# Patient Record
Sex: Female | Born: 1970 | Race: White | Hispanic: No | Marital: Married | Smoking: Current every day smoker
Health system: Southern US, Community
[De-identification: ages and names within clinical notes are randomized; demographics above are authoritative.]

## PROBLEM LIST (undated history)

## (undated) DIAGNOSIS — K219 Gastro-esophageal reflux disease without esophagitis: Secondary | ICD-10-CM

## (undated) DIAGNOSIS — F410 Panic disorder [episodic paroxysmal anxiety] without agoraphobia: Secondary | ICD-10-CM

## (undated) DIAGNOSIS — M549 Dorsalgia, unspecified: Secondary | ICD-10-CM

## (undated) DIAGNOSIS — G8929 Other chronic pain: Secondary | ICD-10-CM

## (undated) DIAGNOSIS — F419 Anxiety disorder, unspecified: Secondary | ICD-10-CM

## (undated) HISTORY — PX: TUBAL LIGATION: SHX77

## (undated) HISTORY — PX: ORIF FACIAL FRACTURE: SHX2118

---

## 2001-03-17 ENCOUNTER — Emergency Department (HOSPITAL_COMMUNITY): Admission: EM | Admit: 2001-03-17 | Discharge: 2001-03-17 | Payer: Self-pay | Admitting: Emergency Medicine

## 2001-05-25 ENCOUNTER — Emergency Department (HOSPITAL_COMMUNITY): Admission: EM | Admit: 2001-05-25 | Discharge: 2001-05-25 | Payer: Self-pay | Admitting: *Deleted

## 2001-06-14 ENCOUNTER — Emergency Department (HOSPITAL_COMMUNITY): Admission: EM | Admit: 2001-06-14 | Discharge: 2001-06-14 | Payer: Self-pay | Admitting: Internal Medicine

## 2001-06-14 ENCOUNTER — Encounter: Payer: Self-pay | Admitting: Internal Medicine

## 2001-06-23 ENCOUNTER — Emergency Department (HOSPITAL_COMMUNITY): Admission: EM | Admit: 2001-06-23 | Discharge: 2001-06-24 | Payer: Self-pay | Admitting: Emergency Medicine

## 2001-07-02 ENCOUNTER — Emergency Department (HOSPITAL_COMMUNITY): Admission: EM | Admit: 2001-07-02 | Discharge: 2001-07-02 | Payer: Self-pay | Admitting: Emergency Medicine

## 2001-07-29 ENCOUNTER — Emergency Department (HOSPITAL_COMMUNITY): Admission: EM | Admit: 2001-07-29 | Discharge: 2001-07-29 | Payer: Self-pay | Admitting: Emergency Medicine

## 2001-08-16 ENCOUNTER — Emergency Department (HOSPITAL_COMMUNITY): Admission: EM | Admit: 2001-08-16 | Discharge: 2001-08-16 | Payer: Self-pay | Admitting: Emergency Medicine

## 2001-08-31 ENCOUNTER — Emergency Department (HOSPITAL_COMMUNITY): Admission: EM | Admit: 2001-08-31 | Discharge: 2001-08-31 | Payer: Self-pay | Admitting: Internal Medicine

## 2001-09-02 ENCOUNTER — Emergency Department (HOSPITAL_COMMUNITY): Admission: EM | Admit: 2001-09-02 | Discharge: 2001-09-02 | Payer: Self-pay | Admitting: Emergency Medicine

## 2001-09-19 ENCOUNTER — Emergency Department (HOSPITAL_COMMUNITY): Admission: EM | Admit: 2001-09-19 | Discharge: 2001-09-19 | Payer: Self-pay

## 2001-10-05 ENCOUNTER — Emergency Department (HOSPITAL_COMMUNITY): Admission: EM | Admit: 2001-10-05 | Discharge: 2001-10-05 | Payer: Self-pay | Admitting: Emergency Medicine

## 2001-10-06 ENCOUNTER — Emergency Department (HOSPITAL_COMMUNITY): Admission: EM | Admit: 2001-10-06 | Discharge: 2001-10-06 | Payer: Self-pay | Admitting: *Deleted

## 2001-11-05 ENCOUNTER — Emergency Department (HOSPITAL_COMMUNITY): Admission: EM | Admit: 2001-11-05 | Discharge: 2001-11-06 | Payer: Self-pay | Admitting: Internal Medicine

## 2001-11-15 ENCOUNTER — Emergency Department (HOSPITAL_COMMUNITY): Admission: EM | Admit: 2001-11-15 | Discharge: 2001-11-16 | Payer: Self-pay | Admitting: *Deleted

## 2001-12-11 ENCOUNTER — Emergency Department (HOSPITAL_COMMUNITY): Admission: EM | Admit: 2001-12-11 | Discharge: 2001-12-11 | Payer: Self-pay | Admitting: *Deleted

## 2002-01-15 ENCOUNTER — Emergency Department (HOSPITAL_COMMUNITY): Admission: EM | Admit: 2002-01-15 | Discharge: 2002-01-15 | Payer: Self-pay | Admitting: Emergency Medicine

## 2002-02-07 ENCOUNTER — Emergency Department (HOSPITAL_COMMUNITY): Admission: EM | Admit: 2002-02-07 | Discharge: 2002-02-07 | Payer: Self-pay | Admitting: Internal Medicine

## 2002-02-19 ENCOUNTER — Emergency Department (HOSPITAL_COMMUNITY): Admission: EM | Admit: 2002-02-19 | Discharge: 2002-02-19 | Payer: Self-pay | Admitting: Emergency Medicine

## 2002-02-24 ENCOUNTER — Emergency Department (HOSPITAL_COMMUNITY): Admission: EM | Admit: 2002-02-24 | Discharge: 2002-02-24 | Payer: Self-pay | Admitting: *Deleted

## 2002-03-29 ENCOUNTER — Emergency Department (HOSPITAL_COMMUNITY): Admission: EM | Admit: 2002-03-29 | Discharge: 2002-03-29 | Payer: Self-pay | Admitting: Emergency Medicine

## 2002-04-10 ENCOUNTER — Emergency Department (HOSPITAL_COMMUNITY): Admission: EM | Admit: 2002-04-10 | Discharge: 2002-04-10 | Payer: Self-pay | Admitting: *Deleted

## 2002-05-13 ENCOUNTER — Emergency Department (HOSPITAL_COMMUNITY): Admission: EM | Admit: 2002-05-13 | Discharge: 2002-05-13 | Payer: Self-pay | Admitting: Emergency Medicine

## 2002-06-10 ENCOUNTER — Emergency Department (HOSPITAL_COMMUNITY): Admission: EM | Admit: 2002-06-10 | Discharge: 2002-06-10 | Payer: Self-pay | Admitting: Emergency Medicine

## 2002-06-16 ENCOUNTER — Emergency Department (HOSPITAL_COMMUNITY): Admission: EM | Admit: 2002-06-16 | Discharge: 2002-06-16 | Payer: Self-pay | Admitting: Emergency Medicine

## 2003-03-20 ENCOUNTER — Emergency Department (HOSPITAL_COMMUNITY): Admission: EM | Admit: 2003-03-20 | Discharge: 2003-03-20 | Payer: Self-pay | Admitting: *Deleted

## 2003-03-20 ENCOUNTER — Encounter: Payer: Self-pay | Admitting: *Deleted

## 2004-01-10 ENCOUNTER — Emergency Department (HOSPITAL_COMMUNITY): Admission: EM | Admit: 2004-01-10 | Discharge: 2004-01-10 | Payer: Self-pay | Admitting: *Deleted

## 2004-02-14 ENCOUNTER — Emergency Department (HOSPITAL_COMMUNITY): Admission: EM | Admit: 2004-02-14 | Discharge: 2004-02-14 | Payer: Self-pay | Admitting: Emergency Medicine

## 2004-08-20 ENCOUNTER — Emergency Department (HOSPITAL_COMMUNITY): Admission: EM | Admit: 2004-08-20 | Discharge: 2004-08-20 | Payer: Self-pay | Admitting: Emergency Medicine

## 2005-04-07 ENCOUNTER — Emergency Department (HOSPITAL_COMMUNITY): Admission: EM | Admit: 2005-04-07 | Discharge: 2005-04-07 | Payer: Self-pay | Admitting: Emergency Medicine

## 2005-05-19 ENCOUNTER — Emergency Department (HOSPITAL_COMMUNITY): Admission: EM | Admit: 2005-05-19 | Discharge: 2005-05-20 | Payer: Self-pay | Admitting: Emergency Medicine

## 2005-06-04 ENCOUNTER — Emergency Department (HOSPITAL_COMMUNITY): Admission: EM | Admit: 2005-06-04 | Discharge: 2005-06-04 | Payer: Self-pay | Admitting: Emergency Medicine

## 2005-06-30 ENCOUNTER — Emergency Department (HOSPITAL_COMMUNITY): Admission: EM | Admit: 2005-06-30 | Discharge: 2005-06-30 | Payer: Self-pay | Admitting: Emergency Medicine

## 2005-10-10 ENCOUNTER — Emergency Department (HOSPITAL_COMMUNITY): Admission: EM | Admit: 2005-10-10 | Discharge: 2005-10-10 | Payer: Self-pay | Admitting: Emergency Medicine

## 2006-09-20 ENCOUNTER — Emergency Department (HOSPITAL_COMMUNITY): Admission: EM | Admit: 2006-09-20 | Discharge: 2006-09-20 | Payer: Self-pay | Admitting: Emergency Medicine

## 2008-01-22 ENCOUNTER — Emergency Department (HOSPITAL_COMMUNITY): Admission: EM | Admit: 2008-01-22 | Discharge: 2008-01-22 | Payer: Self-pay | Admitting: Emergency Medicine

## 2009-04-06 ENCOUNTER — Emergency Department (HOSPITAL_COMMUNITY): Admission: EM | Admit: 2009-04-06 | Discharge: 2009-04-06 | Payer: Self-pay | Admitting: Emergency Medicine

## 2010-10-09 ENCOUNTER — Emergency Department (HOSPITAL_COMMUNITY)
Admission: EM | Admit: 2010-10-09 | Discharge: 2010-10-09 | Disposition: A | Discharge status: Home / Self Care | Payer: Medicaid Other | Attending: Emergency Medicine | Admitting: Emergency Medicine

## 2010-10-09 DIAGNOSIS — M549 Dorsalgia, unspecified: Secondary | ICD-10-CM | POA: Insufficient documentation

## 2011-01-28 ENCOUNTER — Emergency Department (HOSPITAL_COMMUNITY)
Admission: EM | Admit: 2011-01-28 | Discharge: 2011-01-29 | Disposition: A | Discharge status: Home / Self Care | Payer: Medicaid Other | Attending: Emergency Medicine | Admitting: Emergency Medicine

## 2011-01-28 ENCOUNTER — Encounter: Payer: Self-pay | Admitting: *Deleted

## 2011-01-28 DIAGNOSIS — F17200 Nicotine dependence, unspecified, uncomplicated: Secondary | ICD-10-CM | POA: Insufficient documentation

## 2011-01-28 DIAGNOSIS — M545 Low back pain, unspecified: Secondary | ICD-10-CM | POA: Insufficient documentation

## 2011-01-28 MED ORDER — HYDROCODONE-ACETAMINOPHEN 5-325 MG PO TABS: 1.0000 | ORAL_TABLET | ORAL | Status: AC | PRN

## 2011-01-28 MED ORDER — PREDNISONE 50 MG PO TABS: 50.0000 mg | ORAL_TABLET | Freq: Every day | ORAL | Status: AC

## 2011-01-28 MED ORDER — CYCLOBENZAPRINE HCL 10 MG PO TABS: 10.0000 mg | ORAL_TABLET | Freq: Two times a day (BID) | ORAL | Status: AC | PRN

## 2011-01-28 MED ORDER — KETOROLAC TROMETHAMINE 60 MG/2ML IM SOLN
60.0000 mg | Freq: Once | INTRAMUSCULAR | Status: AC
  Administered 2011-01-28: 60 mg via INTRAMUSCULAR
  Filled 2011-01-28: qty 2

## 2011-01-28 NOTE — ED Notes (Signed)
Pt c/o lower back pain that radiates down bilateral legs; pt states she does not go to see her doctor until next month

## 2011-01-28 NOTE — ED Provider Notes (Signed)
History     Chief Complaint  Patient presents with  . Back Pain   HPI Comments: Patient complains of lower back pain for approximately one month. Pain radiates to both legs. There's been no trauma. No urinary or bowel incontinence. No obvious injury. 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 A physical her primary care doctor is obtained lower back x-rays. Has a followup appointment in August. His appearance history of depression. Is not presently taking any medications for back pain. Certain movements and palpation seem to bother her back more.  Patient is a 40 y.o. female presenting with back pain. The history is provided by the patient.  Back Pain     History reviewed. No pertinent past medical history.  Past Surgical History  Procedure Date  . Orif facial fracture     pt has plates to left side of face  . Tubal ligation     History reviewed. No pertinent family history.  History  Substance Use Topics  . Smoking status: Current Some Day Smoker  . Smokeless tobacco: Not on file  . Alcohol Use: Yes     occasionally    OB History    Grav Para Term Preterm Abortions TAB SAB Ect Mult Living                  Review of Systems  Musculoskeletal: Positive for back pain.  All other systems reviewed and are negative.    Physical Exam  BP 101/63  Pulse 88  Temp(Src) 98.7 F (37.1 C) (Oral)  Resp 18  Ht 5\' 2"  (1.575 m)  Wt 155 lb (70.308 kg)  BMI 28.35 kg/m2  SpO2 97%  Physical Exam  Nursing note and vitals reviewed. Constitutional: She is oriented to person, place, and time. She appears well-developed and well-nourished.  HENT:  Head: Normocephalic and atraumatic.  Eyes: Conjunctivae and EOM are normal. Pupils are equal, round, and reactive to light.  Neck: Normal range of motion. Neck supple.  Cardiovascular: Normal rate and regular rhythm.   Pulmonary/Chest: Effort normal and breath sounds normal.  Abdominal: Soft. Bowel sounds are normal.    Musculoskeletal: Normal range of motion.       Minimal paraspinous tenderness in lower back. Able to straight leg raise with no neurological deficits.  Neurological: She is alert and oriented to person, place, and time.       Motor sensory and cerebellar exams are all grossly normal.  Skin: Skin is warm and dry.  Psychiatric: She has a normal mood and affect.    ED Course  Procedures  MDM Patient appears to have a stable low back pain with radicular symptoms. She was able to ambulate ambulate. There is no more bladder incontinence. Will discharge patient home with nonsteroidals, prednisone, and pain medication. She will followup with her primary care doctor.      Donnetta Hutching, MD 01/28/11 [phone]

## 2011-06-17 ENCOUNTER — Encounter (HOSPITAL_COMMUNITY): Payer: Self-pay | Admitting: *Deleted

## 2011-06-17 ENCOUNTER — Emergency Department (HOSPITAL_COMMUNITY)
Admission: EM | Admit: 2011-06-17 | Discharge: 2011-06-17 | Disposition: A | Discharge status: Home / Self Care | Payer: Medicaid Other | Attending: Emergency Medicine | Admitting: Emergency Medicine

## 2011-06-17 ENCOUNTER — Emergency Department (HOSPITAL_COMMUNITY): Payer: Medicaid Other

## 2011-06-17 DIAGNOSIS — M5430 Sciatica, unspecified side: Secondary | ICD-10-CM

## 2011-06-17 DIAGNOSIS — R42 Dizziness and giddiness: Secondary | ICD-10-CM | POA: Insufficient documentation

## 2011-06-17 DIAGNOSIS — M549 Dorsalgia, unspecified: Secondary | ICD-10-CM | POA: Insufficient documentation

## 2011-06-17 DIAGNOSIS — F17200 Nicotine dependence, unspecified, uncomplicated: Secondary | ICD-10-CM | POA: Insufficient documentation

## 2011-06-17 LAB — URINALYSIS, ROUTINE W REFLEX MICROSCOPIC
Bilirubin Urine: NEGATIVE
Glucose, UA: NEGATIVE mg/dL
Ketones, ur: NEGATIVE mg/dL
Nitrite: NEGATIVE
Specific Gravity, Urine: 1.025 (ref 1.005–1.030)
pH: 6 (ref 5.0–8.0)

## 2011-06-17 LAB — URINE MICROSCOPIC-ADD ON

## 2011-06-17 MED ORDER — DEXAMETHASONE SODIUM PHOSPHATE 4 MG/ML IJ SOLN
8.0000 mg | Freq: Once | INTRAMUSCULAR | Status: AC
  Administered 2011-06-17: 8 mg via INTRAMUSCULAR
  Filled 2011-06-17: qty 2

## 2011-06-17 MED ORDER — METHOCARBAMOL 500 MG PO TABS: 500.0000 mg | ORAL_TABLET | Freq: Two times a day (BID) | ORAL | Status: AC

## 2011-06-17 MED ORDER — TRAMADOL HCL 50 MG PO TABS: 50.0000 mg | ORAL_TABLET | Freq: Four times a day (QID) | ORAL | Status: AC | PRN

## 2011-06-17 MED ORDER — OXYCODONE-ACETAMINOPHEN 5-325 MG PO TABS
1.0000 | ORAL_TABLET | Freq: Once | ORAL | Status: AC
  Administered 2011-06-17: 1 via ORAL
  Filled 2011-06-17: qty 1

## 2011-06-17 MED ORDER — METHOCARBAMOL 500 MG PO TABS
500.0000 mg | ORAL_TABLET | Freq: Once | ORAL | Status: AC
  Administered 2011-06-17: 500 mg via ORAL
  Filled 2011-06-17: qty 1

## 2011-06-17 NOTE — ED Notes (Signed)
Pt c/o mid back pain radiating down both legs. Unable to sleep

## 2011-06-17 NOTE — ED Provider Notes (Signed)
History     CSN: [id number] Arrival date & time: 06/17/2011  1:29 AM   First MD Initiated Contact with Patient 06/17/11 0232      Chief Complaint  Patient presents with  . Back Pain    (Consider location/radiation/quality/duration/timing/severity/associated sxs/prior treatment) Patient is a 40 y.o. female presenting with back pain. The history is provided by the patient. No language interpreter was used.  Back Pain  This is a chronic problem. The current episode started more than 1 week ago. The problem occurs constantly. The problem has been gradually worsening. The pain is associated with no known injury. The pain is present in the lumbar spine. The quality of the pain is described as aching. The pain is at a severity of 10/10. The pain is severe. The symptoms are aggravated by bending, twisting and certain positions. The pain is the same all the time. Stiffness is present all day. Pertinent negatives include no chest pain, no fever, no numbness, no weight loss, no headaches, no abdominal pain, no abdominal swelling, no bowel incontinence, no perianal numbness, no bladder incontinence, no dysuria, no pelvic pain, no leg pain, no paresthesias, no paresis, no tingling and no weakness. She has tried nothing for the symptoms. The treatment provided no relief.    History reviewed. No pertinent past medical history.  Past Surgical History  Procedure Date  . Orif facial fracture     pt has plates to left side of face  . Tubal ligation     History reviewed. No pertinent family history.  History  Substance Use Topics  . Smoking status: Current Some Day Smoker  . Smokeless tobacco: Not on file  . Alcohol Use: Yes     occasionally    OB History    Grav Para Term Preterm Abortions TAB SAB Ect Mult Living                  Review of Systems  Constitutional: Negative for fever and weight loss.  HENT: Negative for facial swelling.   Eyes: Negative for discharge.  Respiratory:  Negative for apnea and chest tightness.   Cardiovascular: Negative for chest pain.  Gastrointestinal: Negative for abdominal pain, abdominal distention and bowel incontinence.  Genitourinary: Negative for bladder incontinence, dysuria, urgency, frequency, hematuria, flank pain, vaginal bleeding, vaginal discharge, difficulty urinating, menstrual problem and pelvic pain.  Musculoskeletal: Positive for back pain.  Neurological: Positive for dizziness. Negative for tingling, weakness, numbness, headaches and paresthesias.  Hematological: Negative.   Psychiatric/Behavioral: Negative.     Allergies  Ibuprofen  Home Medications  No current outpatient prescriptions on file.  BP 111/76  Pulse 98  Temp 98.4 F (36.9 C)  Resp 20  Ht 5\' 5"  (1.651 m)  Wt 156 lb (70.761 kg)  BMI 25.96 kg/m2  SpO2 100%  LMP 06/10/2011  Physical Exam  Constitutional: She is oriented to person, place, and time. She appears well-developed and well-nourished.  HENT:  Head: Normocephalic and atraumatic.  Eyes: EOM are normal. Pupils are equal, round, and reactive to light.  Neck: Normal range of motion. Neck supple. No thyromegaly present.  Cardiovascular: Normal rate and regular rhythm.   Pulmonary/Chest: Effort normal and breath sounds normal. She has no wheezes.  Abdominal: Soft. Bowel sounds are normal.  Musculoskeletal: Normal range of motion. She exhibits no tenderness.       No step offs or point tenderness or crepitance over the spine.  Intact l5/s1 intact perineal sensation  Neurological: She is alert and oriented to  person, place, and time. She has normal reflexes.  Skin: Skin is warm and dry.  Psychiatric: She has a normal mood and affect.    ED Course  Procedures (including critical care time)  Labs Reviewed  URINALYSIS, ROUTINE W REFLEX MICROSCOPIC - Abnormal; Notable for the following:    Leukocytes, UA TRACE (*)    All other components within normal limits  URINE MICROSCOPIC-ADD ON -  Abnormal; Notable for the following:    Squamous Epithelial / LPF MANY (*)    Bacteria, UA MANY (*)    All other components within normal limits  POCT PREGNANCY, URINE  POCT PREGNANCY, URINE   No results found.   No diagnosis found.    MDM  Follow up with your family doctor for ongoing care.  Return for weakness numbness bowel and or bladder incontinence fevers, chills weight loss or any concerns.  Patient verbalizes understanding and agrees to follow up        Amaryah Mallen Smitty Cords, MD 06/17/11 [phone]

## 2011-12-25 ENCOUNTER — Encounter (HOSPITAL_COMMUNITY): Payer: Self-pay | Admitting: Emergency Medicine

## 2011-12-25 DIAGNOSIS — Z886 Allergy status to analgesic agent status: Secondary | ICD-10-CM | POA: Insufficient documentation

## 2011-12-25 DIAGNOSIS — M545 Low back pain, unspecified: Secondary | ICD-10-CM | POA: Insufficient documentation

## 2011-12-25 DIAGNOSIS — M542 Cervicalgia: Secondary | ICD-10-CM | POA: Insufficient documentation

## 2011-12-25 DIAGNOSIS — M25559 Pain in unspecified hip: Secondary | ICD-10-CM | POA: Insufficient documentation

## 2011-12-25 DIAGNOSIS — F17200 Nicotine dependence, unspecified, uncomplicated: Secondary | ICD-10-CM | POA: Insufficient documentation

## 2011-12-25 NOTE — ED Notes (Addendum)
Patient complaining of lower back pain, neck pain, and bilateral leg pain x 1 month. Denies injury.

## 2011-12-26 ENCOUNTER — Emergency Department (HOSPITAL_COMMUNITY)
Admission: EM | Admit: 2011-12-26 | Discharge: 2011-12-26 | Disposition: A | Discharge status: Home / Self Care | Payer: Medicaid Other | Attending: Emergency Medicine | Admitting: Emergency Medicine

## 2011-12-26 DIAGNOSIS — M25559 Pain in unspecified hip: Secondary | ICD-10-CM

## 2011-12-26 DIAGNOSIS — M545 Low back pain: Secondary | ICD-10-CM

## 2011-12-26 DIAGNOSIS — M542 Cervicalgia: Secondary | ICD-10-CM

## 2011-12-26 HISTORY — DX: Anxiety disorder, unspecified: F41.9

## 2011-12-26 HISTORY — DX: Panic disorder (episodic paroxysmal anxiety): F41.0

## 2011-12-26 HISTORY — DX: Gastro-esophageal reflux disease without esophagitis: K21.9

## 2011-12-26 HISTORY — DX: Dorsalgia, unspecified: M54.9

## 2011-12-26 HISTORY — DX: Other chronic pain: G89.29

## 2011-12-26 MED ORDER — OXYCODONE-ACETAMINOPHEN 5-325 MG PO TABS: 1.0000 | ORAL_TABLET | ORAL | Status: AC | PRN

## 2011-12-26 MED ORDER — OXYCODONE-ACETAMINOPHEN 5-325 MG PO TABS
1.0000 | ORAL_TABLET | Freq: Once | ORAL | Status: AC
  Administered 2011-12-26: 1 via ORAL
  Filled 2011-12-26: qty 1

## 2011-12-26 NOTE — Discharge Instructions (Signed)
See your primary care provider tomorrow as scheduled.  Chronic Pain Chronic pain can be defined as pain that is lasting, off and on, and lasts for 3 to 6 months or longer. Many things cause chronic pain, which can make it difficult to make a discrete diagnosis. There are many treatment options available for chronic pain. However, finding a treatment that works well for you may require trying various approaches until a suitable one is found. CAUSES  In some types of chronic medical conditions, the pain is caused by a normal pain response within the body. A normal pain response helps the body identify illness or injury and prevent further damage from being done. In these cases, the cause of the pain may be identified and treated, even if it may not be cured completely. Examples of chronic conditions which can cause chronic pain include:  Inflammation of the joints (arthritis).   Back pain or neck pain (including bulging or herniated disks).   Migraine headaches.   Cancer.  In some other types of chronic pain syndromes, the pain is caused by an abnormal pain response within the body. An abnormal pain response is present when there is no ongoing cause (or stimulus) for the pain, or when the cause of the pain is arising from the nerves or nervous system itself. Examples of conditions which can cause chronic pain due to an abnormal pain response include:  Fibromyalgia.   Reflex sympathetic dystrophy (RSD).   Neuropathy (when the nerves themselves are damaged, and may cause pain).  DIAGNOSIS  Your caregiver will help diagnose your condition over time. In many cases, the initial focus will be on excluding conditions that could be causing the pain. Depending on your symptoms, your caregiver may order some tests to diagnose your condition. Some of these tests include:  Blood tests.   Computerized X-ray scans (CT scan).   Computerized magnetic scans (MRI).   X-rays.   Ultrasounds.   Nerve  conduction studies.   Consultation with other physicians or specialists.  TREATMENT  There are many treatment options for people suffering from chronic pain. Finding a treatment that works well may take time.   You may be referred to a pain management specialist.   You may be put on medication to help with the pain. Unfortunately, some medications (such as opiate medications) may not be very effective in cases where chronic pain is due to abnormal pain responses. Finding the right medications can take some time.   Adjunctive therapies may be used to provide additional relief and improve a patient's quality of life. These therapies include:   Mindfulness meditation.   Acupuncture.   Biofeedback.   Cognitive-behavioral therapy.   In certain cases, surgical interventions may be attempted.  HOME CARE INSTRUCTIONS   Make sure you understand these instructions prior to discharge.   Ask any questions and share any further concerns you have with your caregiver prior to discharge.   Take all medications as directed by your caregiver.   Keep all follow-up appointments.  SEEK MEDICAL CARE IF:   Your pain gets worse.   You develop a new pain that was not present before.   You cannot tolerate any medications prescribed by your caregiver.   You develop new symptoms since your last visit with your caregiver.  SEEK IMMEDIATE MEDICAL CARE IF:   You develop muscular weakness.   You have decreased sensation or numbness.   You lose control of bowel or bladder function.   Your pain suddenly  gets much worse.   You have an oral temperature above 102 F (38.9 C), not controlled by medication.   You develop shaking chills, confusion, chest pain, or shortness of breath.  Document Released: 03/17/2002 Document Revised: 06/14/2011 Document Reviewed: 06/23/2008 [facility] Patient Information 2012 [place], [place].  Smoking Hazards Smoking cigarettes is extremely bad for your health. Tobacco  smoke has over 200 known poisons in it. There are over 60 chemicals in tobacco smoke that cause cancer. Some of the chemicals found in cigarette smoke include:   Cyanide.   Benzene.   Formaldehyde.   Methanol (wood alcohol).   Acetylene (fuel used in welding torches).   Ammonia.  Cigarette smoke also contains the poisonous gases nitrogen oxide and carbon monoxide.  Cigarette smokers have an increased risk of many serious medical problems, including:  Lung cancer.   Lung disease (such as pneumonia, bronchitis, and emphysema).   Heart attack and chest pain due to the heart not getting enough oxygen (angina).   Heart disease and peripheral blood vessel disease.   Hypertension.   Stroke.   Oral cancer (cancer of the lip, mouth, or voice box).   Bladder cancer.   Pancreatic cancer.   Cervical cancer.   Pregnancy complications, including premature birth.   Low birthweight babies.   Early menopause.   Lower estrogen level for women.   Infertility.   Facial wrinkles.   Blindness.   Increased risk of broken bones (fractures).   Senile dementia.   Stillbirths and smaller newborn babies, birth defects, and genetic damage to sperm.   Stomach ulcers and internal bleeding.  Children of smokers have an increased risk of the following, because of secondhand smoke exposure:   Sudden infant death syndrome (SIDS).   Respiratory infections.   Lung cancer.   Heart disease.   Ear infections.  Smoking causes approximately:  90% of all lung cancer deaths in men.   80% of all lung cancer deaths in women.   90% of deaths from chronic obstructive lung disease.  Compared with nonsmokers, smoking increases the risk of:  Coronary heart disease by 2 to 4 times.   Stroke by 2 to 4 times.   Men developing lung cancer by 23 times.   Women developing lung cancer by 13 times.   Dying from chronic obstructive lung diseases by 12 times.  Someone who smokes 2 packs a  day loses about 8 years of his or her life. Even smoking lightly shortens your life expectancy by several years. You can greatly reduce the risk of medical problems for you and your family by stopping now. Smoking is the most preventable cause of death and disease in our society. Within days of quitting smoking, your circulation returns to normal, you decrease the risk of having a heart attack, and your lung capacity improves. There may be some increased phlegm in the first few days after quitting, and it may take months for your lungs to clear up completely. Quitting for 10 years cuts your lung cancer risk to almost that of a nonsmoker. WHY IS SMOKING ADDICTIVE?  Nicotine is the chemical agent in tobacco that is capable of causing addiction or dependence.   When you smoke and inhale, nicotine is absorbed rapidly into the bloodstream through your lungs. Nicotine absorbed through the lungs is capable of creating a powerful addiction. Both inhaled and non-inhaled nicotine may be addictive.   Addiction studies of cigarettes and spit tobacco show that addiction to nicotine occurs mainly during the teen years,  when young people begin using tobacco products.  WHAT ARE THE BENEFITS OF QUITTING?  There are many health benefits to quitting smoking.   Likelihood of developing cancer and heart disease decreases. Health improvements are seen almost immediately.   Blood pressure, pulse rate, and breathing patterns start returning to normal soon after quitting.   People who quit may see an improvement in their overall quality of life.  Some people choose to quit all at once. Other options include nicotine replacement products, such as patches, gum, and nasal sprays. Do not use these products without first checking with your caregiver. QUITTING SMOKING It is not easy to quit smoking. Nicotine is addicting, and longtime habits are hard to change. To start, you can write down all your reasons for quitting, tell  your family and friends you want to quit, and ask for their help. Throw your cigarettes away, chew gum or cinnamon sticks, keep your hands busy, and drink extra water or juice. Go for walks and practice deep breathing to relax. Think of all the money you are saving: around $1,000 a year, for the average pack-a-day smoker. Nicotine patches and gum have been shown to improve success at efforts to stop smoking. Zyban (bupropion) is an anti-depressant drug that can be prescribed to reduce nicotine withdrawal symptoms and to suppress the urge to smoke. Smoking is an addiction with both physical and psychological effects. Joining a stop-smoking support group can help you cope with the emotional issues. For more information and advice on programs to stop smoking, call your doctor, your local hospital, or these organizations:  American Lung Association - 1-800-LUNGUSA   American Cancer Society - 1-800-ACS-2345  Document Released: 08/02/2004 Document Revised: 06/14/2011 Document Reviewed: 04/06/2009 [facility] Patient Information 2012 [place], [place].  Acetaminophen; Oxycodone tablets What is this medicine? ACETAMINOPHEN; OXYCODONE (a set a MEE noe fen; ox i KOE done) is a pain reliever. It is used to treat mild to moderate pain. This medicine may be used for other purposes; ask your health care provider or pharmacist if you have questions. What should I tell my health care provider before I take this medicine? They need to know if you have any of these conditions: -brain tumor -Crohn's disease, inflammatory bowel disease, or ulcerative colitis -drink more than 3 alcohol containing drinks per day -drug abuse or addiction -head injury -heart or circulation problems -kidney disease or problems going to the bathroom -liver disease -lung disease, asthma, or breathing problems -an unusual or allergic reaction to acetaminophen, oxycodone, other opioid analgesics, other medicines, foods, dyes, or  preservatives -pregnant or trying to get pregnant -breast-feeding How should I use this medicine? Take this medicine by mouth with a full glass of water. Follow the directions on the prescription label. Take your medicine at regular intervals. Do not take your medicine more often than directed. Talk to your pediatrician regarding the use of this medicine in children. Special care may be needed. Patients over 41 years old may have a stronger reaction and need a smaller dose. Overdosage: If you think you have taken too much of this medicine contact a poison control center or emergency room at once. NOTE: This medicine is only for you. Do not share this medicine with others. What if I miss a dose? If you miss a dose, take it as soon as you can. If it is almost time for your next dose, take only that dose. Do not take double or extra doses. What may interact with this medicine? -alcohol or  medicines that contain alcohol -antihistamines -barbiturates like amobarbital, butalbital, butabarbital, methohexital, pentobarbital, phenobarbital, thiopental, and secobarbital -benztropine -drugs for bladder problems like solifenacin, trospium, oxybutynin, tolterodine, hyoscyamine, and methscopolamine -drugs for breathing problems like ipratropium and tiotropium -drugs for certain stomach or intestine problems like propantheline, homatropine methylbromide, glycopyrrolate, atropine, belladonna, and dicyclomine -general anesthetics like etomidate, ketamine, nitrous oxide, propofol, desflurane, enflurane, halothane, isoflurane, and sevoflurane -medicines for depression, anxiety, or psychotic disturbances -medicines for pain like codeine, morphine, pentazocine, buprenorphine, butorphanol, nalbuphine, tramadol, and propoxyphene -medicines for sleep -muscle relaxants -naltrexone -phenothiazines like perphenazine, thioridazine, chlorpromazine, mesoridazine, fluphenazine, prochlorperazine, promazine, and  trifluoperazine -scopolamine -trihexyphenidyl This list may not describe all possible interactions. Give your health care provider a list of all the medicines, herbs, non-prescription drugs, or dietary supplements you use. Also tell them if you smoke, drink alcohol, or use illegal drugs. Some items may interact with your medicine. What should I watch for while using this medicine? Tell your doctor or health care professional if your pain does not go away, if it gets worse, or if you have new or a different type of pain. You may develop tolerance to the medicine. Tolerance means that you will need a higher dose of the medication for pain relief. Tolerance is normal and is expected if you take this medicine for a long time. Do not suddenly stop taking your medicine because you may develop a severe reaction. Your body becomes used to the medicine. This does NOT mean you are addicted. Addiction is a behavior related to getting and using a drug for a nonmedical reason. If you have pain, you have a medical reason to take pain medicine. Your doctor will tell you how much medicine to take. If your doctor wants you to stop the medicine, the dose will be slowly lowered over time to avoid any side effects. You may get drowsy or dizzy. Do not drive, use machinery, or do anything that needs mental alertness until you know how this medicine affects you. Do not stand or sit up quickly, especially if you are an older patient. This reduces the risk of dizzy or fainting spells. Alcohol may interfere with the effect of this medicine. Avoid alcoholic drinks. The medicine will cause constipation. Try to have a bowel movement at least every 2 to 3 days. If you do not have a bowel movement for 3 days, call your doctor or health care professional. Do not take Tylenol (acetaminophen) or medicines that have acetaminophen with this medicine. Too much acetaminophen can be very dangerous. Many nonprescription medicines contain  acetaminophen. Always read the labels carefully to avoid taking more acetaminophen. What side effects may I notice from receiving this medicine? Side effects that you should report to your doctor or health care professional as soon as possible: -allergic reactions like skin rash, itching or hives, swelling of the face, lips, or tongue -breathing difficulties, wheezing -confusion -light headedness or fainting spells -severe stomach pain -yellowing of the skin or the whites of the eyes Side effects that usually do not require medical attention (report to your doctor or health care professional if they continue or are bothersome): -dizziness -drowsiness -nausea -vomiting This list may not describe all possible side effects. Call your doctor for medical advice about side effects. You may report side effects to FDA at 1-800-FDA-1088. Where should I keep my medicine? Keep out of the reach of children. This medicine can be abused. Keep your medicine in a safe place to protect it from theft. Do not share  this medicine with anyone. Selling or giving away this medicine is dangerous and against the law. Store at room temperature between 20 and 25 degrees C (68 and 77 degrees F). Keep container tightly closed. Protect from light. Flush any unused medicines down the toilet. Do not use the medicine after the expiration date. NOTE: This sheet is a summary. It may not cover all possible information. If you have questions about this medicine, talk to your doctor, pharmacist, or health care provider.  2012, [employer]. (05/24/2008 10:01:21 AM)

## 2011-12-26 NOTE — ED Provider Notes (Signed)
History     CSN: [id number]  Arrival date & time 12/25/11  2243   First MD Initiated Contact with Patient 12/26/11 0148      Chief Complaint  Patient presents with  . Back Pain  . Neck Pain    (Consider location/radiation/quality/duration/timing/severity/associated sxs/prior treatment) Patient is a 41 y.o. female presenting with back pain and neck pain. The history is provided by the patient.  Back Pain   Neck Pain   She has been having pain in her lower back and in her neck for several months, but it is getting worse. She has an appointment with her doctor on June 20 of her states that the pain as severe that she couldn't wait L. Nothing makes it better nothing makes it worse. She's tried taking acetaminophen for with no relief. She rates pain at 10 over 10. She denies weakness and denies bowel or bladder dysfunction. She is complaining of some numbness in in the right upper back just lateral to her neck but no extremity numbness.  Past Medical History  Diagnosis Date  . Chronic back pain   . Anxiety   . Panic attacks   . GERD (gastroesophageal reflux disease)     Past Surgical History  Procedure Date  . Orif facial fracture     pt has plates to left side of face  . Tubal ligation     History reviewed. No pertinent family history.  History  Substance Use Topics  . Smoking status: Current Some Day Smoker  . Smokeless tobacco: Not on file  . Alcohol Use: Yes     occasionally    OB History    Grav Para Term Preterm Abortions TAB SAB Ect Mult Living                  Review of Systems  HENT: Positive for neck pain.   Musculoskeletal: Positive for back pain.  All other systems reviewed and are negative.    Allergies  Ibuprofen  Home Medications   Current Outpatient Rx  Name Route Sig Dispense Refill  . OXYCODONE-ACETAMINOPHEN 5-325 MG PO TABS Oral Take 1 tablet by mouth every 4 (four) hours as needed for pain. 6 tablet 0    BP 127/75  Pulse 76  Temp  98 F (36.7 C) (Oral)  Resp 20  Ht 5\' 3"  (1.6 m)  Wt 145 lb (65.772 kg)  BMI 25.69 kg/m2  SpO2 100%  LMP 12/06/2011  Physical Exam  Nursing note and vitals reviewed.  41 year old female is resting comfortably and in no acute distress. Vital signs are normal. Head is normocephalic and atraumatic. PERRLA, EOMI. Oropharynx is clear. Neck is supple. There is mild tenderness in the midline of the neck and moderate tenderness in the right paracervical muscles. Full passive range of motion is present. Back has mild tenderness in the lumbar area. There's negative straight leg raise. Lungs are clear without rales, wheezes, or rhonchi. Heart has regular rate rhythm without murmur. Abdomen is soft, flat, nontender without masses or hepatosplenomegaly. Extremities: There is full range of motion of all joints without pain, no cyanosis or edema. Skin is warm and dry without rash. Neurologic: Mental status is normal, cranial nerves are intact, there are no motor Center deficits.  ED Course  Procedures (including critical care time)   1. Low back pain   2. Neck pain   3. Pain in joint, pelvic region and thigh       MDM  Chronic neck and back  pain. I reviewed her prior records and she has been seen in the emergency department several times for back pain and sciatica. Have reviewed her record on the [place] controlled substance reporting website and she has no narcotic prescriptions in the last 6 months. She will be sent home with a to go pack of Percocet which should be sufficient to last her until she can see her PCP on June 20.        Dione Booze, MD 12/26/11 [phone]

## 2012-01-14 ENCOUNTER — Ambulatory Visit: Payer: Medicaid Other | Admitting: Physical Therapy

## 2012-02-18 ENCOUNTER — Encounter (HOSPITAL_COMMUNITY): Payer: Self-pay | Admitting: *Deleted

## 2012-02-18 ENCOUNTER — Emergency Department (HOSPITAL_COMMUNITY)
Admission: EM | Admit: 2012-02-18 | Discharge: 2012-02-19 | Disposition: A | Discharge status: Home / Self Care | Payer: Medicaid Other | Attending: Emergency Medicine | Admitting: Emergency Medicine

## 2012-02-18 DIAGNOSIS — G8929 Other chronic pain: Secondary | ICD-10-CM | POA: Insufficient documentation

## 2012-02-18 DIAGNOSIS — S39012A Strain of muscle, fascia and tendon of lower back, initial encounter: Secondary | ICD-10-CM

## 2012-02-18 DIAGNOSIS — S161XXA Strain of muscle, fascia and tendon at neck level, initial encounter: Secondary | ICD-10-CM

## 2012-02-18 DIAGNOSIS — S335XXA Sprain of ligaments of lumbar spine, initial encounter: Secondary | ICD-10-CM | POA: Insufficient documentation

## 2012-02-18 DIAGNOSIS — S139XXA Sprain of joints and ligaments of unspecified parts of neck, initial encounter: Secondary | ICD-10-CM | POA: Insufficient documentation

## 2012-02-18 DIAGNOSIS — K219 Gastro-esophageal reflux disease without esophagitis: Secondary | ICD-10-CM | POA: Insufficient documentation

## 2012-02-18 DIAGNOSIS — F17200 Nicotine dependence, unspecified, uncomplicated: Secondary | ICD-10-CM | POA: Insufficient documentation

## 2012-02-18 DIAGNOSIS — X58XXXA Exposure to other specified factors, initial encounter: Secondary | ICD-10-CM | POA: Insufficient documentation

## 2012-02-18 NOTE — ED Notes (Signed)
Neck , back and  bil leg pain, No injury,

## 2012-02-19 MED ORDER — CYCLOBENZAPRINE HCL 5 MG PO TABS: 5.0000 mg | ORAL_TABLET | Freq: Three times a day (TID) | ORAL | Status: AC | PRN

## 2012-02-19 MED ORDER — KETOROLAC TROMETHAMINE 60 MG/2ML IM SOLN
60.0000 mg | Freq: Once | INTRAMUSCULAR | Status: AC
  Administered 2012-02-19: 60 mg via INTRAMUSCULAR
  Filled 2012-02-19: qty 2

## 2012-02-19 MED ORDER — HYDROCODONE-ACETAMINOPHEN 5-325 MG PO TABS: 1.0000 | ORAL_TABLET | ORAL | Status: AC | PRN

## 2012-02-19 NOTE — ED Provider Notes (Signed)
History     CSN: [id number]  Arrival date & time 02/18/12  2159   First MD Initiated Contact with Patient 02/19/12 0009      Chief Complaint  Patient presents with  . Back Pain    (Consider location/radiation/quality/duration/timing/severity/associated sxs/prior treatment) HPI Comments: Maria Curry  presents with acute on chronic low back pain and neck pain which has which has been present for the past 3 days.   Patient denies any new injury specifically, but states her pain is similar to other flare ups of her neck and lower back pain.  She does have  A tingling sensation at her right upper back as well which is consistent with other flare ups.  She denies weakness or radiation of pain into her arms.  There is radiation into her bilateral buttocks.  There has been no weakness or numbness in the lower extremities and no urinary or bowel retention or incontinence.  Patient does not have a history of cancer or IVDU.   The history is provided by the patient.    Past Medical History  Diagnosis Date  . Chronic back pain   . Anxiety   . Panic attacks   . GERD (gastroesophageal reflux disease)     Past Surgical History  Procedure Date  . Orif facial fracture     pt has plates to left side of face  . Tubal ligation     History reviewed. No pertinent family history.  History  Substance Use Topics  . Smoking status: Current Some Day Smoker  . Smokeless tobacco: Not on file  . Alcohol Use: Yes     occasionally    OB History    Grav Para Term Preterm Abortions TAB SAB Ect Mult Living                  Review of Systems  Constitutional: Negative for fever.  HENT: Positive for neck pain. Negative for neck stiffness.   Respiratory: Negative for shortness of breath.   Cardiovascular: Negative for chest pain and leg swelling.  Gastrointestinal: Negative for abdominal pain, constipation and abdominal distention.  Genitourinary: Negative for dysuria, urgency, frequency, flank  pain and difficulty urinating.  Musculoskeletal: Positive for back pain. Negative for joint swelling and gait problem.  Skin: Negative for rash.  Neurological: Negative for weakness and numbness.    Allergies  Ibuprofen  Home Medications   Current Outpatient Rx  Name Route Sig Dispense Refill  . CYCLOBENZAPRINE HCL 5 MG PO TABS Oral Take 1 tablet (5 mg total) by mouth 3 (three) times daily as needed for muscle spasms. 15 tablet 0  . HYDROCODONE-ACETAMINOPHEN 5-325 MG PO TABS Oral Take 1 tablet by mouth every 4 (four) hours as needed for pain. 15 tablet 0    BP 105/74  Pulse 110  Temp 98.4 F (36.9 C) (Oral)  Resp 20  Ht 5\' 2"  (1.575 m)  Wt 150 lb (68.04 kg)  BMI 27.44 kg/m2  SpO2 100%  LMP 02/04/2012  Physical Exam  Nursing note and vitals reviewed. Constitutional: She appears well-developed and well-nourished.  HENT:  Head: Normocephalic.  Eyes: Conjunctivae are normal.  Neck: Normal range of motion. Neck supple.  Cardiovascular: Normal rate and intact distal pulses.        Pedal pulses normal.  Pulmonary/Chest: Effort normal.  Abdominal: Soft. Bowel sounds are normal. She exhibits no distension and no mass.  Musculoskeletal: Normal range of motion. She exhibits no edema.  Cervical back: She exhibits tenderness and spasm. She exhibits no bony tenderness and no swelling.       Lumbar back: She exhibits tenderness. She exhibits no swelling, no edema and no spasm.       Paralumbar ttp.  No SI joint tenderness. Right trapezius muscle spasm.  Neurological: She is alert. She has normal strength. She displays no atrophy and no tremor. No sensory deficit. Gait normal.  Reflex Scores:      Patellar reflexes are 2+ on the right side and 2+ on the left side.      Achilles reflexes are 2+ on the right side and 2+ on the left side.      No strength deficit noted in hip and knee flexor and extensor muscle groups.  Ankle flexion and extension intact.  Equal grip strength  Skin:  Skin is warm and dry.  Psychiatric: She has a normal mood and affect.    ED Course  Procedures (including critical care time)  Labs Reviewed - No data to display No results found.   1. Lumbar strain   2. Cervical strain       MDM  Rest,  Heat pad,  Flexeril, hydrocodone.  Pt encouraged to f/u with pcp at [facility] for further management of her chronic back probs.  No neuro deficit on exam or by history to suggest emergent or surgical presentation.  Also discussed worsened sx that should prompt immediate re-evaluation including distal weakness, bowel/bladder retention/incontinence.              Burgess Amor, PA 02/19/12 1455

## 2012-02-19 NOTE — ED Notes (Signed)
Discharge instructions given and reviewed with patient.  Prescriptions given for Vicodin and Flexeril; effects and use explained.  Patient verbalized understanding of sedating effects of medications.  Patient ambulatory with steady gait; discharged home in good condition.

## 2012-02-20 NOTE — ED Provider Notes (Signed)
Medical screening examination/treatment/procedure(s) were performed by non-physician practitioner and as supervising physician I was immediately available for consultation/collaboration.  Nicoletta Dress. Colon Branch, MD 02/20/12 [phone]

## 2012-05-23 ENCOUNTER — Emergency Department (HOSPITAL_COMMUNITY)
Admission: EM | Admit: 2012-05-23 | Discharge: 2012-05-23 | Disposition: A | Discharge status: Home / Self Care | Payer: Medicaid Other | Attending: Emergency Medicine | Admitting: Emergency Medicine

## 2012-05-23 ENCOUNTER — Encounter (HOSPITAL_COMMUNITY): Payer: Self-pay | Admitting: *Deleted

## 2012-05-23 DIAGNOSIS — S39012A Strain of muscle, fascia and tendon of lower back, initial encounter: Secondary | ICD-10-CM

## 2012-05-23 DIAGNOSIS — F17200 Nicotine dependence, unspecified, uncomplicated: Secondary | ICD-10-CM | POA: Insufficient documentation

## 2012-05-23 DIAGNOSIS — Y9389 Activity, other specified: Secondary | ICD-10-CM | POA: Insufficient documentation

## 2012-05-23 DIAGNOSIS — F410 Panic disorder [episodic paroxysmal anxiety] without agoraphobia: Secondary | ICD-10-CM | POA: Insufficient documentation

## 2012-05-23 DIAGNOSIS — X503XXA Overexertion from repetitive movements, initial encounter: Secondary | ICD-10-CM | POA: Insufficient documentation

## 2012-05-23 DIAGNOSIS — Z8719 Personal history of other diseases of the digestive system: Secondary | ICD-10-CM | POA: Insufficient documentation

## 2012-05-23 DIAGNOSIS — Y929 Unspecified place or not applicable: Secondary | ICD-10-CM | POA: Insufficient documentation

## 2012-05-23 DIAGNOSIS — S335XXA Sprain of ligaments of lumbar spine, initial encounter: Secondary | ICD-10-CM | POA: Insufficient documentation

## 2012-05-23 DIAGNOSIS — G8929 Other chronic pain: Secondary | ICD-10-CM | POA: Insufficient documentation

## 2012-05-23 DIAGNOSIS — Z79899 Other long term (current) drug therapy: Secondary | ICD-10-CM | POA: Insufficient documentation

## 2012-05-23 MED ORDER — CYCLOBENZAPRINE HCL 5 MG PO TABS: 5.0000 mg | ORAL_TABLET | Freq: Three times a day (TID) | ORAL | Status: DC | PRN

## 2012-05-23 MED ORDER — HYDROCODONE-ACETAMINOPHEN 5-325 MG PO TABS: 1.0000 | ORAL_TABLET | ORAL | Status: AC | PRN

## 2012-05-23 NOTE — ED Notes (Signed)
Low back and bil leg pain for 2 years, neck pain for 6mos.  Helped move beds last week, and thinks she "overdid it"

## 2012-05-23 NOTE — ED Provider Notes (Signed)
History     CSN: [id number]  Arrival date & time 05/23/12  1641   First MD Initiated Contact with Patient 05/23/12 1658      Chief Complaint  Patient presents with  . Back Pain    (Consider location/radiation/quality/duration/timing/severity/associated sxs/prior treatment) HPI Comments: Maria Curry  presents with acute on chronic low back pain which has which has been present for the past 7 days.   Patient denies any new injury specifically, but did help move some furniture last weekend,  Waking the next day with pain.  There is radiation into her bilateral upper thighs.  There has been no weakness or numbness in the lower extremities and no urinary or bowel retention or incontinence.  Patient does not have a history of cancer or IVDU.   The history is provided by the patient.    Past Medical History  Diagnosis Date  . Chronic back pain   . Anxiety   . Panic attacks   . GERD (gastroesophageal reflux disease)     Past Surgical History  Procedure Date  . Orif facial fracture     pt has plates to left side of face  . Tubal ligation     History reviewed. No pertinent family history.  History  Substance Use Topics  . Smoking status: Current Some Day Smoker  . Smokeless tobacco: Not on file  . Alcohol Use: Yes     Comment: occasionally    OB History    Grav Para Term Preterm Abortions TAB SAB Ect Mult Living                  Review of Systems  Constitutional: Negative for fever.  Respiratory: Negative for shortness of breath.   Cardiovascular: Negative for chest pain and leg swelling.  Gastrointestinal: Negative for abdominal pain, constipation and abdominal distention.  Genitourinary: Negative for dysuria, urgency, frequency, flank pain and difficulty urinating.  Musculoskeletal: Positive for back pain. Negative for joint swelling and gait problem.  Skin: Negative for rash.  Neurological: Negative for weakness and numbness.    Allergies  Bee venom and  Ibuprofen  Home Medications   Current Outpatient Rx  Name  Route  Sig  Dispense  Refill  . DULOXETINE HCL 60 MG PO CPEP   Oral   Take 60 mg by mouth daily.         . CYCLOBENZAPRINE HCL 5 MG PO TABS   Oral   Take 1 tablet (5 mg total) by mouth 3 (three) times daily as needed for muscle spasms.   15 tablet   0   . HYDROCODONE-ACETAMINOPHEN 5-325 MG PO TABS   Oral   Take 1 tablet by mouth every 4 (four) hours as needed for pain.   15 tablet   0     BP 99/67  Pulse 92  Temp 98 F (36.7 C) (Oral)  Resp 20  Ht 5\' 2"  (1.575 m)  Wt 152 lb (68.947 kg)  BMI 27.80 kg/m2  SpO2 100%  LMP 05/16/2012  Physical Exam  Nursing note and vitals reviewed. Constitutional: She appears well-developed and well-nourished.  HENT:  Head: Normocephalic.  Eyes: Conjunctivae normal are normal.  Neck: Normal range of motion. Neck supple.  Cardiovascular: Normal rate and intact distal pulses.        Pedal pulses normal.  Pulmonary/Chest: Effort normal.  Abdominal: Soft. Bowel sounds are normal. She exhibits no distension and no mass.  Musculoskeletal: Normal range of motion. She exhibits tenderness. She exhibits  no edema.       Lumbar back: She exhibits tenderness. She exhibits no bony tenderness, no swelling, no edema and no spasm.       Bilateral paralumbar ttp.  Neurological: She is alert. She has normal strength. She displays no atrophy and no tremor. No sensory deficit. Gait normal.  Reflex Scores:      Patellar reflexes are 2+ on the right side and 2+ on the left side.      Achilles reflexes are 2+ on the right side and 2+ on the left side.      No strength deficit noted in hip and knee flexor and extensor muscle groups.  Ankle flexion and extension intact.  Skin: Skin is warm and dry.  Psychiatric: She has a normal mood and affect.    ED Course  Procedures (including critical care time)  Labs Reviewed - No data to display No results found.   1. Lumbar strain       MDM    No neuro deficit on exam or by history to suggest emergent or surgical presentation.  Also discussed worsened sx that should prompt immediate re-evaluation including distal weakness, bowel/bladder retention/incontinence.  Flexeril,  Hydrocodone,  Heating pad,  Avoid lifting, bending, etc.  Recheck by pcp if not improved in 1 week.  Patient states she had an mri of her back in [facility] showing herniated disk,  But pt states pcp hasn't referred her to anyone.  Given number for Dr. Mikal Plane, but also advised pt she may need her referral to come from her pcp.              Burgess Amor, PA 05/23/12 1724

## 2012-05-23 NOTE — ED Provider Notes (Signed)
Medical screening examination/treatment/procedure(s) were performed by non-physician practitioner and as supervising physician I was immediately available for consultation/collaboration.   Kalila Adkison L Delanna Blacketer, MD 05/23/12 2326 

## 2012-07-23 ENCOUNTER — Encounter (HOSPITAL_COMMUNITY): Payer: Self-pay | Admitting: *Deleted

## 2012-07-23 ENCOUNTER — Emergency Department (HOSPITAL_COMMUNITY)
Admission: EM | Admit: 2012-07-23 | Discharge: 2012-07-23 | Disposition: A | Discharge status: Home / Self Care | Payer: Medicaid Other | Attending: Emergency Medicine | Admitting: Emergency Medicine

## 2012-07-23 DIAGNOSIS — M545 Low back pain, unspecified: Secondary | ICD-10-CM | POA: Insufficient documentation

## 2012-07-23 DIAGNOSIS — F17200 Nicotine dependence, unspecified, uncomplicated: Secondary | ICD-10-CM | POA: Insufficient documentation

## 2012-07-23 DIAGNOSIS — Z8659 Personal history of other mental and behavioral disorders: Secondary | ICD-10-CM | POA: Insufficient documentation

## 2012-07-23 DIAGNOSIS — G8929 Other chronic pain: Secondary | ICD-10-CM | POA: Insufficient documentation

## 2012-07-23 DIAGNOSIS — M79609 Pain in unspecified limb: Secondary | ICD-10-CM | POA: Insufficient documentation

## 2012-07-23 DIAGNOSIS — M549 Dorsalgia, unspecified: Secondary | ICD-10-CM

## 2012-07-23 DIAGNOSIS — Z8719 Personal history of other diseases of the digestive system: Secondary | ICD-10-CM | POA: Insufficient documentation

## 2012-07-23 MED ORDER — OXYCODONE-ACETAMINOPHEN 5-325 MG PO TABS
2.0000 | ORAL_TABLET | Freq: Once | ORAL | Status: AC
  Administered 2012-07-23: 2 via ORAL
  Filled 2012-07-23: qty 2

## 2012-07-23 MED ORDER — MELOXICAM 15 MG PO TABS: 15.0000 mg | ORAL_TABLET | Freq: Every day | ORAL | Status: DC

## 2012-07-23 MED ORDER — HYDROCODONE-ACETAMINOPHEN 5-325 MG PO TABS: 1.0000 | ORAL_TABLET | ORAL | Status: DC | PRN

## 2012-07-23 NOTE — ED Notes (Signed)
Pt was helping someone that was having a seizure, and hurt her back.2 days ago

## 2012-07-23 NOTE — ED Provider Notes (Signed)
History     CSN: [id number]  Arrival date & time 07/23/12  1114   First MD Initiated Contact with Patient 07/23/12 1159      Chief Complaint  Patient presents with  . Back Pain    (Consider location/radiation/quality/duration/timing/severity/associated sxs/prior treatment) The history is provided by the patient and medical records. No language interpreter was used.     Maria Curry is a 42 y.o. female who complains of low back pain for 2 day(s), positional with bending or lifting, with radiation down the legs.  She does not have any shooting pain or paresthesia.  Her complaint is generalized leg pain. Precipitating factors patient was holding her brother down who is having a seizure. Prior history of back problems: recurrent self limited episodes of low back pain in the past. There is no numbness in the legs.Denies weakness, loss of bowel/bladder function or saddle anesthesia.  Patient is a smoker with a history of depression and anxiety. Denies neck stiffness, headache, rash.  Denies fever or recent procedures to back.  Denies urinary or vaginal symptoms. No other complaints at this time     Past Medical History  Diagnosis Date  . Chronic back pain   . Anxiety   . Panic attacks   . GERD (gastroesophageal reflux disease)     Past Surgical History  Procedure Date  . Orif facial fracture     pt has plates to left side of face  . Tubal ligation     History reviewed. No pertinent family history.  History  Substance Use Topics  . Smoking status: Current Some Day Smoker  . Smokeless tobacco: Not on file  . Alcohol Use: Yes     Comment: occasionally    OB History    Grav Para Term Preterm Abortions TAB SAB Ect Mult Living                  Review of Systems Ten systems reviewed and are negative for acute change, except as noted in the HPI.   Allergies  Bee venom and Ibuprofen  Home Medications   Current Outpatient Rx  Name  Route  Sig  Dispense  Refill  .  ALPRAZOLAM 0.25 MG PO TABS   Oral   Take 0.25 mg by mouth daily as needed. Nerves         . DULOXETINE HCL 60 MG PO CPEP   Oral   Take 60 mg by mouth daily.           BP 99/59  Pulse 96  Temp 98.1 F (36.7 C) (Oral)  Resp 20  Ht 5\' 2"  (1.575 m)  Wt 142 lb (64.411 kg)  BMI 25.97 kg/m2  SpO2 100%  LMP 05/04/2012  Physical Exam Nursing note reviewed.  Vital signs and previous medical records reviewed. Patient appears to be in mild to moderate pain, antalgic gait noted. Lumbosacral spine area reveals no local tenderness or mass.  Painful and reduced LS ROM noted.  Straight leg raise is negative  Bilaterally. DTR's, motor strength and sensation normal, including heel and toe gait.  Peripheral pulses are palpable.  Abdomen soft, bowel sounds normal nontender nondistended. Heart regular rate and rhythm no murmurs gallops or rubs. Chest clear to auscultation bilaterally.  ED Course  Procedures (including critical care time)  Labs Reviewed - No data to display No results found.   1. Back pain       MDM  12:32 PM BP 99/59  Pulse 96  Temp  98.1 F (36.7 C) (Oral)  Resp 20  Ht 5\' 2"  (1.575 m)  Wt 142 lb (64.411 kg)  BMI 25.97 kg/m2  SpO2 100%  LMP 05/04/2012  Patient with back pain.  No neurological deficits and normal neuro exam.  Patient can walk but states is painful.  No loss of bowel or bladder control.  No concern for cauda equina.  No fever, night sweats, weight loss, h/o cancer, IVDU.  RICE protocol and pain medicine indicated and discussed with patient.         Arthor Captain, PA-C 07/24/12 2020

## 2012-07-27 NOTE — ED Provider Notes (Signed)
Medical screening examination/treatment/procedure(s) were performed by non-physician practitioner and as supervising physician I was immediately available for consultation/collaboration.   Shelda Jakes, MD 07/27/12 1420

## 2012-08-25 ENCOUNTER — Encounter (HOSPITAL_COMMUNITY): Payer: Self-pay | Admitting: *Deleted

## 2012-08-25 ENCOUNTER — Emergency Department (HOSPITAL_COMMUNITY)
Admission: EM | Admit: 2012-08-25 | Discharge: 2012-08-25 | Disposition: A | Discharge status: Home / Self Care | Payer: Medicaid Other | Attending: Emergency Medicine | Admitting: Emergency Medicine

## 2012-08-25 DIAGNOSIS — G8929 Other chronic pain: Secondary | ICD-10-CM | POA: Insufficient documentation

## 2012-08-25 DIAGNOSIS — Z8719 Personal history of other diseases of the digestive system: Secondary | ICD-10-CM | POA: Insufficient documentation

## 2012-08-25 DIAGNOSIS — F411 Generalized anxiety disorder: Secondary | ICD-10-CM | POA: Insufficient documentation

## 2012-08-25 DIAGNOSIS — M549 Dorsalgia, unspecified: Secondary | ICD-10-CM

## 2012-08-25 DIAGNOSIS — M545 Low back pain, unspecified: Secondary | ICD-10-CM | POA: Insufficient documentation

## 2012-08-25 DIAGNOSIS — F17200 Nicotine dependence, unspecified, uncomplicated: Secondary | ICD-10-CM | POA: Insufficient documentation

## 2012-08-25 DIAGNOSIS — Z79899 Other long term (current) drug therapy: Secondary | ICD-10-CM | POA: Insufficient documentation

## 2012-08-25 DIAGNOSIS — Z8659 Personal history of other mental and behavioral disorders: Secondary | ICD-10-CM | POA: Insufficient documentation

## 2012-08-25 MED ORDER — TRAMADOL HCL 50 MG PO TABS: 50.0000 mg | ORAL_TABLET | Freq: Four times a day (QID) | ORAL | Status: DC | PRN

## 2012-08-25 MED ORDER — DIAZEPAM 5 MG PO TABS
5.0000 mg | ORAL_TABLET | Freq: Once | ORAL | Status: AC
  Administered 2012-08-25: 5 mg via ORAL
  Filled 2012-08-25: qty 1

## 2012-08-25 MED ORDER — PREDNISONE 50 MG PO TABS
60.0000 mg | ORAL_TABLET | Freq: Once | ORAL | Status: AC
  Administered 2012-08-25: 60 mg via ORAL
  Filled 2012-08-25: qty 1

## 2012-08-25 MED ORDER — METHOCARBAMOL 500 MG PO TABS: 500.0000 mg | ORAL_TABLET | Freq: Three times a day (TID) | ORAL | Status: DC

## 2012-08-25 MED ORDER — TRAMADOL HCL 50 MG PO TABS
50.0000 mg | ORAL_TABLET | Freq: Once | ORAL | Status: AC
  Administered 2012-08-25: 50 mg via ORAL
  Filled 2012-08-25: qty 1

## 2012-08-25 MED ORDER — PREDNISONE 10 MG PO TABS: ORAL_TABLET | ORAL | Status: DC

## 2012-08-25 NOTE — ED Provider Notes (Signed)
History     CSN: [id number]  Arrival date & time 08/25/12  1825   First MD Initiated Contact with Patient 08/25/12 2019      Chief Complaint  Patient presents with  . Leg Pain  . Back Pain    (Consider location/radiation/quality/duration/timing/severity/associated sxs/prior treatment) Patient is a 42 y.o. female presenting with back pain. The history is provided by the patient.  Back Pain Location:  Lumbar spine Quality:  Aching Radiates to:  L thigh and R thigh Pain severity:  Moderate Pain is:  Same all the time Onset quality:  Gradual Timing:  Intermittent Progression:  Unchanged Chronicity:  Chronic Context comment:  Walking in the snow. Relieved by:  Nothing Worsened by:  Ambulation Associated symptoms: leg pain   Associated symptoms: no abdominal pain, no bowel incontinence, no chest pain, no dysuria and no numbness     Past Medical History  Diagnosis Date  . Chronic back pain   . Anxiety   . Panic attacks   . GERD (gastroesophageal reflux disease)     Past Surgical History  Procedure Laterality Date  . Orif facial fracture      pt has plates to left side of face  . Tubal ligation      History reviewed. No pertinent family history.  History  Substance Use Topics  . Smoking status: Current Some Day Smoker  . Smokeless tobacco: Not on file  . Alcohol Use: Yes     Comment: occasionally    OB History   Grav Para Term Preterm Abortions TAB SAB Ect Mult Living                  Review of Systems  Constitutional: Negative for activity change.       All ROS Neg except as noted in HPI  HENT: Negative for nosebleeds and neck pain.   Eyes: Negative for photophobia and discharge.  Respiratory: Negative for cough, shortness of breath and wheezing.   Cardiovascular: Negative for chest pain and palpitations.  Gastrointestinal: Negative for abdominal pain, blood in stool and bowel incontinence.  Genitourinary: Negative for dysuria, frequency and  hematuria.  Musculoskeletal: Positive for back pain. Negative for arthralgias.  Skin: Negative.   Neurological: Negative for dizziness, seizures, speech difficulty and numbness.  Psychiatric/Behavioral: Negative for hallucinations and confusion. The patient is nervous/anxious.     Allergies  Bee venom and Ibuprofen  Home Medications   Current Outpatient Rx  Name  Route  Sig  Dispense  Refill  . ALPRAZolam (XANAX) 0.25 MG tablet   Oral   Take 0.25 mg by mouth daily as needed. Nerves         . DULoxetine (CYMBALTA) 60 MG capsule   Oral   Take 60 mg by mouth daily.         Marland Kitchen HYDROcodone-acetaminophen (NORCO) 5-325 MG per tablet   Oral   Take 1 tablet by mouth every 4 (four) hours as needed for pain.   6 tablet   0   . meloxicam (MOBIC) 15 MG tablet   Oral   Take 1 tablet (15 mg total) by mouth daily. Take 1 daily with food.   10 tablet   0     BP 103/70  Pulse 102  Temp(Src) 97.7 F (36.5 C) (Oral)  Resp 20  Ht 5\' 2"  (1.575 m)  Wt 150 lb (68.04 kg)  BMI 27.43 kg/m2  SpO2 100%  LMP 07/06/2012  Physical Exam  Nursing note and vitals reviewed. Constitutional:  She is oriented to person, place, and time. She appears well-developed and well-nourished.  Non-toxic appearance.  Pt sleeping upon my arrival, but able to arouse.  HENT:  Head: Normocephalic.  Right Ear: Tympanic membrane and external ear normal.  Left Ear: Tympanic membrane and external ear normal.  Eyes: EOM and lids are normal. Pupils are equal, round, and reactive to light.  Neck: Normal range of motion. Neck supple. Carotid bruit is not present.  Cardiovascular: Normal rate, regular rhythm, normal heart sounds, intact distal pulses and normal pulses.   Pulmonary/Chest: Breath sounds normal. No respiratory distress.  Abdominal: Soft. Bowel sounds are normal. There is no tenderness. There is no guarding.  Musculoskeletal: Normal range of motion.  Lumbar area pain to palpation and attempted ROM. No  palpable step off.  Lymphadenopathy:       Head (right side): No submandibular adenopathy present.       Head (left side): No submandibular adenopathy present.    She has no cervical adenopathy.  Neurological: She is alert and oriented to person, place, and time. She has normal strength. No cranial nerve deficit or sensory deficit. She exhibits normal muscle tone. Coordination normal.  No gross neuro deficit.  Skin: Skin is warm and dry.  Psychiatric: She has a normal mood and affect. Her speech is normal.    ED Course  Procedures (including critical care time)  Labs Reviewed - No data to display No results found.   No diagnosis found.    MDM  I have reviewed nursing notes, vital signs, and all appropriate lab and imaging results for this patient.   Is a patient has a history of chronic back pain. For 2 nights evaluation reveals an acute on chronic exacerbation. The patient is treated with Robaxin 500 mg 3 times daily, prednisone taper, and Ultram one every 6 hours as needed for pain. Patient is advised to see her primary physician for additional evaluation and management of her chronic.      Kathie Dike, [place] 08/26/12 [phone]

## 2012-08-25 NOTE — ED Notes (Signed)
Back pain , bil leg pain since Friday,   No fall.

## 2012-08-26 NOTE — ED Provider Notes (Signed)
Medical screening examination/treatment/procedure(s) were performed by non-physician practitioner and as supervising physician I was immediately available for consultation/collaboration.  Gilda Crease, MD 08/26/12 [phone]

## 2012-09-22 ENCOUNTER — Encounter (HOSPITAL_COMMUNITY): Payer: Self-pay | Admitting: Emergency Medicine

## 2012-09-22 ENCOUNTER — Emergency Department (HOSPITAL_COMMUNITY)
Admission: EM | Admit: 2012-09-22 | Discharge: 2012-09-22 | Disposition: A | Discharge status: Home / Self Care | Payer: Medicaid Other | Attending: Emergency Medicine | Admitting: Emergency Medicine

## 2012-09-22 DIAGNOSIS — Z8719 Personal history of other diseases of the digestive system: Secondary | ICD-10-CM | POA: Insufficient documentation

## 2012-09-22 DIAGNOSIS — F411 Generalized anxiety disorder: Secondary | ICD-10-CM | POA: Insufficient documentation

## 2012-09-22 DIAGNOSIS — G8929 Other chronic pain: Secondary | ICD-10-CM | POA: Insufficient documentation

## 2012-09-22 DIAGNOSIS — Z79899 Other long term (current) drug therapy: Secondary | ICD-10-CM | POA: Insufficient documentation

## 2012-09-22 DIAGNOSIS — M5412 Radiculopathy, cervical region: Secondary | ICD-10-CM | POA: Insufficient documentation

## 2012-09-22 DIAGNOSIS — F17200 Nicotine dependence, unspecified, uncomplicated: Secondary | ICD-10-CM | POA: Insufficient documentation

## 2012-09-22 DIAGNOSIS — F410 Panic disorder [episodic paroxysmal anxiety] without agoraphobia: Secondary | ICD-10-CM | POA: Insufficient documentation

## 2012-09-22 DIAGNOSIS — M25519 Pain in unspecified shoulder: Secondary | ICD-10-CM | POA: Insufficient documentation

## 2012-09-22 DIAGNOSIS — M62838 Other muscle spasm: Secondary | ICD-10-CM | POA: Insufficient documentation

## 2012-09-22 DIAGNOSIS — M79609 Pain in unspecified limb: Secondary | ICD-10-CM | POA: Insufficient documentation

## 2012-09-22 MED ORDER — METHOCARBAMOL 500 MG PO TABS: 1000.0000 mg | ORAL_TABLET | Freq: Once | ORAL | Status: DC

## 2012-09-22 MED ORDER — HYDROCODONE-ACETAMINOPHEN 5-325 MG PO TABS: 1.0000 | ORAL_TABLET | Freq: Once | ORAL | Status: DC

## 2012-09-22 MED ORDER — PREDNISONE 50 MG PO TABS
60.0000 mg | ORAL_TABLET | Freq: Once | ORAL | Status: AC
  Administered 2012-09-22: 60 mg via ORAL
  Filled 2012-09-22: qty 1

## 2012-09-22 MED ORDER — METHOCARBAMOL 500 MG PO TABS: 1000.0000 mg | ORAL_TABLET | Freq: Four times a day (QID) | ORAL | Status: AC

## 2012-09-22 MED ORDER — HYDROCODONE-ACETAMINOPHEN 5-325 MG PO TABS: 1.0000 | ORAL_TABLET | ORAL | Status: DC | PRN

## 2012-09-22 MED ORDER — PREDNISONE 10 MG PO TABS: ORAL_TABLET | ORAL | Status: DC

## 2012-09-22 NOTE — ED Provider Notes (Signed)
History     CSN: [id number]  Arrival date & time 09/22/12  1019   First MD Initiated Contact with Patient 09/22/12 1035      Chief Complaint  Patient presents with  . Back Pain  . Leg Pain  . Shoulder Pain    (Consider location/radiation/quality/duration/timing/severity/associated sxs/prior treatment) HPI Comments: Maria Curry is a 42 y.o. Female presenting with acute on chronic upper back and neck pain which has which has been worsened for the past several weeks.  She has seen her pcp for this, reporting she had an MRI at [facility] last fall which showed she had 2 herniated disks in her neck.  She has developed intermittent pain for the past 2 weeks which radiates into her forearms and is positional,  Stating it is worse with rotation of her head right or left.   Patient denies any new injury specifically.    There has been no weakness or numbness in her arms or hands or her lower extremities either, and no urinary or bowel retention or incontinence. She has not notified her pcp of this progressing pain. Patient does not have a history of cancer or IVDU.  She also describes shoulder muscle tightness and spasm.  She has taken tylenol without relief.  Her pain is intermittent and sharp.      The history is provided by the patient.    Past Medical History  Diagnosis Date  . Chronic back pain   . Anxiety   . Panic attacks   . GERD (gastroesophageal reflux disease)     Past Surgical History  Procedure Laterality Date  . Orif facial fracture      pt has plates to left side of face  . Tubal ligation      No family history on file.  History  Substance Use Topics  . Smoking status: Current Some Day Smoker  . Smokeless tobacco: Not on file  . Alcohol Use: Yes     Comment: occasionally    OB History   Grav Para Term Preterm Abortions TAB SAB Ect Mult Living                  Review of Systems  Constitutional: Negative for fever.  HENT: Positive for neck  pain.   Respiratory: Negative for shortness of breath.   Cardiovascular: Negative for chest pain and leg swelling.  Gastrointestinal: Negative for abdominal pain, constipation and abdominal distention.  Genitourinary: Negative for dysuria, urgency, frequency, flank pain and difficulty urinating.  Musculoskeletal: Positive for back pain. Negative for joint swelling and gait problem.  Skin: Negative for rash.  Neurological: Negative for weakness and numbness.    Allergies  Bee venom and Ibuprofen  Home Medications   Current Outpatient Rx  Name  Route  Sig  Dispense  Refill  . acetaminophen (TYLENOL) 500 MG tablet   Oral   Take 1,000 mg by mouth daily as needed for pain.         Marland Kitchen ALPRAZolam (XANAX) 0.25 MG tablet   Oral   Take 0.25 mg by mouth daily as needed. Nerves         . DULoxetine (CYMBALTA) 60 MG capsule   Oral   Take 60 mg by mouth daily.         Marland Kitchen HYDROcodone-acetaminophen (NORCO/VICODIN) 5-325 MG per tablet   Oral   Take 1 tablet by mouth every 4 (four) hours as needed for pain.   15 tablet   0   .  methocarbamol (ROBAXIN) 500 MG tablet   Oral   Take 2 tablets (1,000 mg total) by mouth 4 (four) times daily.   40 tablet   0   . predniSONE (DELTASONE) 10 MG tablet      6, 5, 4, 3, 2 then 1 tablet by mouth daily for 6 days total.   21 tablet   0     BP 122/71  Pulse 112  Temp(Src) 98.4 F (36.9 C) (Oral)  Resp 16  Ht 5\' 2"  (1.575 m)  Wt 140 lb (63.504 kg)  BMI 25.6 kg/m2  SpO2 100%  LMP 09/05/2012  Physical Exam  Nursing note and vitals reviewed. Constitutional: She appears well-developed and well-nourished.  HENT:  Head: Normocephalic.  Eyes: Conjunctivae are normal.  Neck: Normal range of motion. Neck supple.  Cardiovascular: Normal rate and intact distal pulses.   Pedal pulses normal.  Pulmonary/Chest: Effort normal.  Abdominal: Soft. Bowel sounds are normal. She exhibits no distension and no mass.  Musculoskeletal: Normal range of  motion.       Cervical back: She exhibits pain and spasm. She exhibits no bony tenderness, no edema and normal pulse.  Neurological: She is alert. She has normal strength. She displays no atrophy and no tremor. No sensory deficit. Gait normal.  Reflex Scores:      Bicep reflexes are 2+ on the right side and 2+ on the left side.      Patellar reflexes are 2+ on the right side and 2+ on the left side.      Achilles reflexes are 2+ on the right side and 2+ on the left side. No strength deficit noted in hip and knee flexor and extensor muscle groups.  Ankle flexion and extension intact.  Equal grip strength.  Skin: Skin is warm and dry.  Psychiatric: She has a normal mood and affect.    ED Course  Procedures (including critical care time)  Labs Reviewed - No data to display No results found.   1. Cervical radiculopathy       MDM  Pt advised to get appt with her pcp this week for a recheck and for referral to neurosurgery if pcp deems appropriate.  She was prescribed hydrocodone,  Prednisone taper (first dose given today) and robaxin.  Encouraged heat therapy.  No neuro deficit on exam or by history to suggest emergent or surgical presentation.  Also discussed worsened sx that should prompt immediate re-evaluation including distal weakness, bowel/bladder retention/incontinence.              Burgess Amor, PA-C 09/22/12 1246

## 2012-09-22 NOTE — ED Notes (Signed)
States she has been having neck, shoulder, back and leg pain.  States that the pain is burning across her shoulder.  States she has been evaluated for this before and did not have a clear answer.  States that she came to the ER today because she is tired of the pain.

## 2012-09-23 NOTE — ED Provider Notes (Signed)
Medical screening examination/treatment/procedure(s) were performed by non-physician practitioner and as supervising physician I was immediately available for consultation/collaboration.  Donnetta Hutching, MD 09/23/12 [phone]

## 2012-10-28 ENCOUNTER — Other Ambulatory Visit: Payer: Self-pay

## 2012-10-28 MED ORDER — ALPRAZOLAM 0.25 MG PO TABS: 0.2500 mg | ORAL_TABLET | Freq: Two times a day (BID) | ORAL | Status: DC

## 2012-10-28 NOTE — Telephone Encounter (Signed)
Callin RX for xanax  

## 2012-10-28 NOTE — Telephone Encounter (Signed)
Last written 09/19/12  Chart says rx up front

## 2012-10-28 NOTE — Telephone Encounter (Signed)
Xanax called to [facility].

## 2012-11-11 ENCOUNTER — Telehealth: Payer: Self-pay | Admitting: Nurse Practitioner

## 2012-11-11 NOTE — Telephone Encounter (Signed)
APPT MADE FOR 5/7 WITH MMM

## 2012-11-12 ENCOUNTER — Encounter: Payer: Self-pay | Admitting: General Practice

## 2012-11-12 ENCOUNTER — Ambulatory Visit (INDEPENDENT_AMBULATORY_CARE_PROVIDER_SITE_OTHER): Payer: Medicaid Other | Admitting: General Practice

## 2012-11-12 DIAGNOSIS — M549 Dorsalgia, unspecified: Secondary | ICD-10-CM

## 2012-11-12 DIAGNOSIS — M19019 Primary osteoarthritis, unspecified shoulder: Secondary | ICD-10-CM

## 2012-11-12 MED ORDER — TRAMADOL HCL 50 MG PO TABS: 50.0000 mg | ORAL_TABLET | Freq: Three times a day (TID) | ORAL | Status: DC | PRN

## 2012-11-12 NOTE — Progress Notes (Signed)
  Subjective:    Patient ID: Maria Curry, female    DOB: 25-Jun-1971, 42 y.o.   MRN: [id number]  HPI Reports today complaining of lower neck pain, right shoulder (with needle pricking sensation), lower back, and bilateral legs down to knees. OTC tyenol, aleve, advil with no relief. Reports vicodin helped to relieve the pain. Reports being more stiff in morning and joints loosen up within the hour. Denies known recent injury.    Review of Systems  Constitutional: Negative for fever and chills.  HENT: Positive for neck pain.   Respiratory: Negative for chest tightness and shortness of breath.   Cardiovascular: Negative for chest pain.  Musculoskeletal: Positive for back pain and joint swelling.  Skin: Negative.   Neurological: Negative for dizziness and headaches.  Psychiatric/Behavioral: Negative.        Objective:   Physical Exam  Constitutional: She appears well-developed and well-nourished.  HENT:  Head: Normocephalic and atraumatic.  Right Ear: External ear normal.  Left Ear: External ear normal.  Mouth/Throat: Oropharynx is clear and moist.  Cardiovascular: Normal rate, regular rhythm and normal heart sounds.   Pulmonary/Chest: Effort normal and breath sounds normal. No respiratory distress. She exhibits no tenderness.  Musculoskeletal: Normal range of motion.  Neurological:  Patient seems drowsy during exam, had to repeat several questions, before an answer received  Skin: Skin is warm and dry.  Psychiatric: She has a normal mood and affect.          Assessment & Plan:  Osteoarthritis Discussed other methods to help decrease pain or discomfort, patient persistent about unless vicodin Patient declines treatment of anything other than vicodin Take medications as prescribed Increase fluid intake Increase physical activity as tolerated RTO if symptoms worsen Patient verbalized understanding Coralie Keens, FNP-C

## 2012-11-12 NOTE — Patient Instructions (Addendum)
Osteoarthritis Osteoarthritis is the most common form of arthritis. It is redness, soreness, and swelling (inflammation) affecting the cartilage. Cartilage acts as a cushion, covering the ends of bones where they meet to form a joint. CAUSES  Over time, the cartilage begins to wear away. This causes bone to rub on bone. This produces pain and stiffness in the affected joints. Factors that contribute to this problem are:  Excessive body weight.  Age.  Overuse of joints. SYMPTOMS   People with osteoarthritis usually experience joint pain, swelling, or stiffness.  Over time, the joint may lose its normal shape.  Small deposits of bone (osteophytes) may grow on the edges of the joint.  Bits of bone or cartilage can break off and float inside the joint space. This may cause more pain and damage.  Osteoarthritis can lead to depression, anxiety, feelings of helplessness, and limitations on daily activities. The most commonly affected joints are in the:  Ends of the fingers.  Thumbs.  Neck.  Lower back.  Knees.  Hips. DIAGNOSIS  Diagnosis is mostly based on your symptoms and exam. Tests may be helpful, including:  X-rays of the affected joint.  A computerized magnetic scan (MRI).  Blood tests to rule out other types of arthritis.  Joint fluid tests. This involves using a needle to draw fluid from the joint and examining the fluid under a microscope. TREATMENT  Goals of treatment are to control pain, improve joint function, maintain a normal body weight, and maintain a healthy lifestyle. Treatment approaches may include:  A prescribed exercise program with rest and joint relief.  Weight control with nutritional education.  Pain relief techniques such as:  Properly applied heat and cold.  Electric pulses delivered to nerve endings under the skin (transcutaneous electrical nerve stimulation, TENS).  Massage.  Certain supplements. Ask your caregiver before using any  supplements, especially in combination with prescribed drugs.  Medicines to control pain, such as:  Acetaminophen.  Nonsteroidal anti-inflammatory drugs (NSAIDs), such as naproxen.  Narcotic or central-acting agents, such as tramadol. This drug carries a risk of addiction and is generally prescribed for short-term use.  Corticosteroids. These can be given orally or as injection. This is a short-term treatment, not recommended for routine use.  Surgery to reposition the bones and relieve pain (osteotomy) or to remove loose pieces of bone and cartilage. Joint replacement may be needed in advanced states of osteoarthritis. HOME CARE INSTRUCTIONS  Your caregiver can recommend specific types of exercise. These may include:  Strengthening exercises. These are done to strengthen the muscles that support joints affected by arthritis. They can be performed with weights or with exercise bands to add resistance.  Aerobic activities. These are exercises, such as brisk walking or low-impact aerobics, that get your heart pumping. They can help keep your lungs and circulatory system in shape.  Range-of-motion activities. These keep your joints limber.  Balance and agility exercises. These help you maintain daily living skills. Learning about your condition and being actively involved in your care will help improve the course of your osteoarthritis. SEEK MEDICAL CARE IF:   You feel hot or your skin turns red.  You develop a rash in addition to your joint pain.  You have an oral temperature above 102 F (38.9 C). FOR MORE INFORMATION  National Institute of Arthritis and Musculoskeletal and Skin Diseases: www.niams.nih.gov National Institute on Aging: www.nia.nih.gov American College of Rheumatology: www.rheumatology.org Document Released: 06/25/2005 Document Revised: 09/17/2011 Document Reviewed: 10/06/2009 Exit[employer] Patient Information 2013 [employer].  

## 2012-12-31 ENCOUNTER — Other Ambulatory Visit: Payer: Self-pay | Admitting: *Deleted

## 2012-12-31 MED ORDER — ALPRAZOLAM 0.25 MG PO TABS: 0.2500 mg | ORAL_TABLET | Freq: Two times a day (BID) | ORAL | Status: DC

## 2012-12-31 NOTE — Telephone Encounter (Signed)
LAST RF 11/10/12. LAST OV 5/14. CALL IN [facility] [phone].

## 2012-12-31 NOTE — Telephone Encounter (Signed)
Please call in xanax with 2 refills 

## 2013-01-01 NOTE — Telephone Encounter (Signed)
Rx done. 

## 2013-02-10 ENCOUNTER — Emergency Department (HOSPITAL_COMMUNITY): Payer: Medicaid Other

## 2013-02-10 ENCOUNTER — Encounter (HOSPITAL_COMMUNITY): Payer: Self-pay

## 2013-02-10 ENCOUNTER — Emergency Department (HOSPITAL_COMMUNITY)
Admission: EM | Admit: 2013-02-10 | Discharge: 2013-02-10 | Disposition: A | Discharge status: Home / Self Care | Payer: Medicaid Other | Attending: Emergency Medicine | Admitting: Emergency Medicine

## 2013-02-10 DIAGNOSIS — Z79899 Other long term (current) drug therapy: Secondary | ICD-10-CM | POA: Insufficient documentation

## 2013-02-10 DIAGNOSIS — Y929 Unspecified place or not applicable: Secondary | ICD-10-CM | POA: Insufficient documentation

## 2013-02-10 DIAGNOSIS — G8929 Other chronic pain: Secondary | ICD-10-CM | POA: Insufficient documentation

## 2013-02-10 DIAGNOSIS — Y939 Activity, unspecified: Secondary | ICD-10-CM | POA: Insufficient documentation

## 2013-02-10 DIAGNOSIS — F17200 Nicotine dependence, unspecified, uncomplicated: Secondary | ICD-10-CM | POA: Insufficient documentation

## 2013-02-10 DIAGNOSIS — Z8719 Personal history of other diseases of the digestive system: Secondary | ICD-10-CM | POA: Insufficient documentation

## 2013-02-10 DIAGNOSIS — W010XXA Fall on same level from slipping, tripping and stumbling without subsequent striking against object, initial encounter: Secondary | ICD-10-CM | POA: Insufficient documentation

## 2013-02-10 DIAGNOSIS — F410 Panic disorder [episodic paroxysmal anxiety] without agoraphobia: Secondary | ICD-10-CM | POA: Insufficient documentation

## 2013-02-10 DIAGNOSIS — S93409A Sprain of unspecified ligament of unspecified ankle, initial encounter: Secondary | ICD-10-CM | POA: Insufficient documentation

## 2013-02-10 MED ORDER — HYDROCODONE-ACETAMINOPHEN 5-325 MG PO TABS: 2.0000 | ORAL_TABLET | ORAL | Status: DC | PRN

## 2013-02-10 NOTE — ED Provider Notes (Signed)
CSN: [id number]     Arrival date & time 02/10/13  1428 History     First MD Initiated Contact with Patient 02/10/13 1512     Chief Complaint  Patient presents with  . Ankle Pain   (Consider location/radiation/quality/duration/timing/severity/associated sxs/prior Treatment) Patient is a 42 y.o. female presenting with ankle pain. The history is provided by the patient. No language interpreter was used.  Ankle Pain Location:  Ankle Time since incident:  1 week Injury: yes   Ankle location:  L ankle Pain details:    Quality:  Aching   Severity:  Mild   Onset quality:  Gradual   Timing:  Constant   Progression:  Worsening Foreign body present:  No foreign bodies Relieved by:  Nothing   Past Medical History  Diagnosis Date  . Chronic back pain   . Anxiety   . Panic attacks   . GERD (gastroesophageal reflux disease)    Past Surgical History  Procedure Laterality Date  . Orif facial fracture      pt has plates to left side of face  . Tubal ligation     Family History  Problem Relation Age of Onset  . Heart disease Mother   . Anuerysm Father   . Epilepsy Sister   . Cirrhosis Brother   . Heart disease Brother   . Epilepsy Brother    History  Substance Use Topics  . Smoking status: Current Some Day Smoker    Types: Cigarettes  . Smokeless tobacco: Not on file  . Alcohol Use: Yes     Comment: occasionally   OB History   Grav Para Term Preterm Abortions TAB SAB Ect Mult Living                 Review of Systems  Musculoskeletal: Positive for myalgias and joint swelling.  All other systems reviewed and are negative.    Allergies  Bee venom and Ibuprofen  Home Medications   Current Outpatient Rx  Name  Route  Sig  Dispense  Refill  . acetaminophen (TYLENOL) 500 MG tablet   Oral   Take 1,000 mg by mouth daily as needed for pain.         Marland Kitchen ALPRAZolam (XANAX) 0.25 MG tablet   Oral   Take 1 tablet (0.25 mg total) by mouth 2 (two) times daily. Nerves   60  tablet   2   . DULoxetine (CYMBALTA) 60 MG capsule   Oral   Take 60 mg by mouth daily.         . predniSONE (DELTASONE) 10 MG tablet      6, 5, 4, 3, 2 then 1 tablet by mouth daily for 6 days total.   21 tablet   0    BP 132/78  Pulse 90  Temp(Src) 98.5 F (36.9 C) (Oral)  Resp 20  Ht 5\' 2"  (1.575 m)  Wt 150 lb (68.04 kg)  BMI 27.43 kg/m2  SpO2 100%  LMP 12/22/2012 Physical Exam  Nursing note and vitals reviewed. Constitutional: She is oriented to person, place, and time. She appears well-developed and well-nourished.  Musculoskeletal: She exhibits tenderness.  Swollen tender left ankle,  From,   No instability,  nv and ns intact  Neurological: She is alert and oriented to person, place, and time. She has normal reflexes.  Skin: Skin is warm.  Psychiatric: She has a normal mood and affect.    ED Course   Procedures (including critical care time)  Labs Reviewed -  No data to display No results found. No diagnosis found.  MDM   Results for orders placed during the hospital encounter of 06/17/11  URINALYSIS, ROUTINE W REFLEX MICROSCOPIC      Result Value Range   Color, Urine YELLOW  YELLOW   APPearance CLEAR  CLEAR   Specific Gravity, Urine 1.025  1.005 - 1.030   pH 6.0  5.0 - 8.0   Glucose, UA NEGATIVE  NEGATIVE mg/dL   Hgb urine dipstick NEGATIVE  NEGATIVE   Bilirubin Urine NEGATIVE  NEGATIVE   Ketones, ur NEGATIVE  NEGATIVE mg/dL   Protein, ur NEGATIVE  NEGATIVE mg/dL   Urobilinogen, UA 0.2  0.0 - 1.0 mg/dL   Nitrite NEGATIVE  NEGATIVE   Leukocytes, UA TRACE (*) NEGATIVE  URINE MICROSCOPIC-ADD ON      Result Value Range   Squamous Epithelial / LPF MANY (*) RARE   WBC, UA 7-10  <3 WBC/hpf   RBC / HPF 0-2  <3 RBC/hpf   Bacteria, UA MANY (*) RARE   Urine-Other CLUE CELLS PRESENT    POCT PREGNANCY, URINE      Result Value Range   Preg Test, Ur NEGATIVE     Dg Ankle Complete Left  02/10/2013   *RADIOLOGY REPORT*  Clinical Data: Pain post trauma  LEFT  ANKLE COMPLETE - 3+ VIEW  Comparison: None.  Findings: Frontal, oblique, and lateral views were obtained.  No fracture. There is a small joint effusion.  Ankle mortise appears intact.  No erosive change.  IMPRESSION: Small joint effusion.  This finding may indicate ligamentous injury.  No fracture.  Mortise intact.   Original Report Authenticated By: Bretta Bang, M.D.     Lonia Skinner [place], PA-C 02/10/13 1530

## 2013-02-10 NOTE — ED Notes (Signed)
Pt reports injuring left ankle 1 week ago, cont. To hurt and she would like it checked out. She is wearing a brace she borrowed from a family member.

## 2013-02-10 NOTE — ED Notes (Signed)
Pt c/o left ankle pain x1 week. Pt states she tripped and rolled her ankle last Monday. Pain has progressively worsened.

## 2013-02-11 NOTE — ED Provider Notes (Signed)
Medical screening examination/treatment/procedure(s) were performed by non-physician practitioner and as supervising physician I was immediately available for consultation/collaboration.  Flint Melter, MD 02/11/13 [phone]

## 2013-03-07 ENCOUNTER — Encounter (HOSPITAL_COMMUNITY): Payer: Self-pay | Admitting: *Deleted

## 2013-03-07 ENCOUNTER — Emergency Department (HOSPITAL_COMMUNITY)
Admission: EM | Admit: 2013-03-07 | Discharge: 2013-03-07 | Disposition: A | Discharge status: Home / Self Care | Payer: Medicaid Other | Attending: Emergency Medicine | Admitting: Emergency Medicine

## 2013-03-07 DIAGNOSIS — G8929 Other chronic pain: Secondary | ICD-10-CM | POA: Insufficient documentation

## 2013-03-07 DIAGNOSIS — M545 Low back pain, unspecified: Secondary | ICD-10-CM | POA: Insufficient documentation

## 2013-03-07 DIAGNOSIS — R000 Tachycardia, unspecified: Secondary | ICD-10-CM | POA: Insufficient documentation

## 2013-03-07 DIAGNOSIS — Z8719 Personal history of other diseases of the digestive system: Secondary | ICD-10-CM | POA: Insufficient documentation

## 2013-03-07 DIAGNOSIS — Z8659 Personal history of other mental and behavioral disorders: Secondary | ICD-10-CM | POA: Insufficient documentation

## 2013-03-07 DIAGNOSIS — F17200 Nicotine dependence, unspecified, uncomplicated: Secondary | ICD-10-CM | POA: Insufficient documentation

## 2013-03-07 MED ORDER — OXYCODONE-ACETAMINOPHEN 5-325 MG PO TABS
1.0000 | ORAL_TABLET | Freq: Once | ORAL | Status: AC
  Administered 2013-03-07: 1 via ORAL
  Filled 2013-03-07: qty 1

## 2013-03-07 MED ORDER — MELOXICAM 7.5 MG PO TABS: 7.5000 mg | ORAL_TABLET | Freq: Every day | ORAL | Status: DC

## 2013-03-07 NOTE — ED Notes (Signed)
Lumbar back pain radiating pain down back of legs bilaterally. Injured back many years ago falling out of tobacco barn resulting in hairline fracture.   No change in bowel or bladder habits.  Has taken tylenol and ibuprofen w/out results.

## 2013-03-07 NOTE — ED Provider Notes (Signed)
CSN: [id number]     Arrival date & time 03/07/13  1758 History   First MD Initiated Contact with Patient 03/07/13 1821     Chief Complaint  Patient presents with  . Back Pain   (Consider location/radiation/quality/duration/timing/severity/associated sxs/prior Treatment) Patient is a 42 y.o. female presenting with back pain. The history is provided by the patient.  Back Pain Location:  Lumbar spine Quality:  Aching, burning and shooting Radiates to:  L posterior upper leg and R posterior upper leg Pain severity:  Severe Pain is:  Same all the time Onset quality:  Gradual Duration:  8 hours Timing:  Constant Progression:  Worsening Chronicity:  Chronic Relieved by:  Nothing Worsened by:  Movement and standing Ineffective treatments:  Ibuprofen Associated symptoms: no bladder incontinence, no bowel incontinence, no dysuria and no fever    Maria Curry is a 42 y.o. female who presents to the ED with low back pain. Hx of chronic back pain since fell out of a tobacco barn many years ago. Has been doing some lifting and this morning started having pain worse than usual in her lower back.   Past Medical History  Diagnosis Date  . Chronic back pain   . Anxiety   . Panic attacks   . GERD (gastroesophageal reflux disease)    Past Surgical History  Procedure Laterality Date  . Orif facial fracture      pt has plates to left side of face  . Tubal ligation     Family History  Problem Relation Age of Onset  . Heart disease Mother   . Anuerysm Father   . Epilepsy Sister   . Cirrhosis Brother   . Heart disease Brother   . Epilepsy Brother    History  Substance Use Topics  . Smoking status: Current Some Day Smoker    Types: Cigarettes  . Smokeless tobacco: Not on file  . Alcohol Use: Yes     Comment: occasionally   OB History   Grav Para Term Preterm Abortions TAB SAB Ect Mult Living                 Review of Systems  Constitutional: Negative for fever and chills.    HENT: Negative for congestion.   Respiratory: Negative for shortness of breath.   Gastrointestinal: Negative for nausea, vomiting and bowel incontinence.  Genitourinary: Negative for bladder incontinence and dysuria.  Musculoskeletal: Positive for back pain.  Skin: Negative for wound.  Psychiatric/Behavioral: The patient is not nervous/anxious.     Allergies  Bee venom and Ibuprofen  Home Medications   Current Outpatient Rx  Name  Route  Sig  Dispense  Refill  . acetaminophen (TYLENOL) 500 MG tablet   Oral   Take 1,000 mg by mouth daily as needed for pain.          BP 125/78  Pulse 114  Temp(Src) 98.9 F (37.2 C) (Oral)  Resp 19  Ht 5\' 2"  (1.575 m)  Wt 150 lb (68.04 kg)  BMI 27.43 kg/m2  SpO2 100%  LMP 01/26/2013 Physical Exam  Nursing note and vitals reviewed. Constitutional: She is oriented to person, place, and time. She appears well-developed and well-nourished. No distress.  HENT:  Head: Normocephalic and atraumatic.  Eyes: EOM are normal.  Neck: Neck supple.  Cardiovascular: Tachycardia present.   Pulmonary/Chest: Effort normal.  Abdominal: Soft. There is no tenderness. There is no CVA tenderness.  Musculoskeletal:       Lumbar back: She exhibits tenderness.  She exhibits normal range of motion, no deformity, no laceration and no spasm.       Back:  Tender with range of motion of lower back. Can bend to touch toes without difficulty. Pedal pulses equal bilateral, adequate circulation, good touch sensation. Straight leg raises without difficulty. Ambulatory without difficulty, no foot drag.   Neurological: She is alert and oriented to person, place, and time. No cranial nerve deficit.  Skin: Skin is warm and dry.  Psychiatric: She has a normal mood and affect. Her behavior is normal.    ED Course  Procedures  MDM  42 y.o. female with chronic low back pain. Increased pain today, here for pain management. Will give patient Percocet x 1 now and Rx for  Mobic. She will follow up with her PCP.  Discussed with the patient and all questioned fully answered   Medication List    TAKE these medications       meloxicam 7.5 MG tablet  Commonly known as:  MOBIC  Take 1 tablet (7.5 mg total) by mouth daily.      ASK your doctor about these medications       acetaminophen 500 MG tablet  Commonly known as:  TYLENOL  Take 1,000 mg by mouth daily as needed for pain.           [facility] Orlene Och, [place] 03/07/13 [phone]

## 2013-03-07 NOTE — ED Notes (Signed)
Pt seen and evaluated by EDNP for initial assessment. 

## 2013-03-07 NOTE — ED Provider Notes (Signed)
Medical screening examination/treatment/procedure(s) were performed by non-physician practitioner and as supervising physician I was immediately available for consultation/collaboration.    Vida Roller, MD 03/07/13 2030

## 2013-03-14 DIAGNOSIS — Z8659 Personal history of other mental and behavioral disorders: Secondary | ICD-10-CM | POA: Insufficient documentation

## 2013-03-14 DIAGNOSIS — Z8719 Personal history of other diseases of the digestive system: Secondary | ICD-10-CM | POA: Insufficient documentation

## 2013-03-14 DIAGNOSIS — R1032 Left lower quadrant pain: Secondary | ICD-10-CM | POA: Insufficient documentation

## 2013-03-14 DIAGNOSIS — Z9851 Tubal ligation status: Secondary | ICD-10-CM | POA: Insufficient documentation

## 2013-03-14 DIAGNOSIS — R1031 Right lower quadrant pain: Secondary | ICD-10-CM | POA: Insufficient documentation

## 2013-03-14 DIAGNOSIS — F17200 Nicotine dependence, unspecified, uncomplicated: Secondary | ICD-10-CM | POA: Insufficient documentation

## 2013-03-14 DIAGNOSIS — R109 Unspecified abdominal pain: Secondary | ICD-10-CM | POA: Insufficient documentation

## 2013-03-14 DIAGNOSIS — N946 Dysmenorrhea, unspecified: Secondary | ICD-10-CM | POA: Insufficient documentation

## 2013-03-14 DIAGNOSIS — M549 Dorsalgia, unspecified: Secondary | ICD-10-CM | POA: Insufficient documentation

## 2013-03-15 ENCOUNTER — Other Ambulatory Visit: Payer: Self-pay | Admitting: Emergency Medicine

## 2013-03-15 ENCOUNTER — Encounter (HOSPITAL_COMMUNITY): Payer: Self-pay

## 2013-03-15 ENCOUNTER — Emergency Department (HOSPITAL_COMMUNITY)
Admission: EM | Admit: 2013-03-15 | Discharge: 2013-03-15 | Disposition: A | Discharge status: Home / Self Care | Payer: Medicaid Other | Attending: Emergency Medicine | Admitting: Emergency Medicine

## 2013-03-15 DIAGNOSIS — N946 Dysmenorrhea, unspecified: Secondary | ICD-10-CM

## 2013-03-15 LAB — CBC WITH DIFFERENTIAL/PLATELET
Basophils Absolute: 0 10*3/uL (ref 0.0–0.1)
Eosinophils Absolute: 0.4 10*3/uL (ref 0.0–0.7)
Lymphs Abs: 3.5 10*3/uL (ref 0.7–4.0)
MCH: 32 pg (ref 26.0–34.0)
Neutrophils Relative %: 57 % (ref 43–77)
Platelets: 205 10*3/uL (ref 150–400)
RBC: 4 MIL/uL (ref 3.87–5.11)
RDW: 13 % (ref 11.5–15.5)
WBC: 10.3 10*3/uL (ref 4.0–10.5)

## 2013-03-15 LAB — COMPREHENSIVE METABOLIC PANEL
ALT: 45 U/L — ABNORMAL HIGH (ref 0–35)
AST: 22 U/L (ref 0–37)
Albumin: 3.5 g/dL (ref 3.5–5.2)
Alkaline Phosphatase: 74 U/L (ref 39–117)
Glucose, Bld: 101 mg/dL — ABNORMAL HIGH (ref 70–99)
Potassium: 3.8 mEq/L (ref 3.5–5.1)
Sodium: 138 mEq/L (ref 135–145)
Total Protein: 6.4 g/dL (ref 6.0–8.3)

## 2013-03-15 LAB — URINE MICROSCOPIC-ADD ON

## 2013-03-15 LAB — URINALYSIS, ROUTINE W REFLEX MICROSCOPIC
Ketones, ur: NEGATIVE mg/dL
Leukocytes, UA: NEGATIVE
Nitrite: NEGATIVE
Specific Gravity, Urine: 1.01 (ref 1.005–1.030)
pH: 7 (ref 5.0–8.0)

## 2013-03-15 MED ORDER — HYDROCODONE-ACETAMINOPHEN 5-325 MG PO TABS: 1.0000 | ORAL_TABLET | ORAL | Status: DC | PRN

## 2013-03-15 MED ORDER — HYDROCODONE-ACETAMINOPHEN 5-325 MG PO TABS
1.0000 | ORAL_TABLET | Freq: Once | ORAL | Status: AC
  Administered 2013-03-15: 1 via ORAL
  Filled 2013-03-15: qty 1

## 2013-03-15 MED ORDER — KETOROLAC TROMETHAMINE 60 MG/2ML IM SOLN
30.0000 mg | Freq: Once | INTRAMUSCULAR | Status: AC
  Administered 2013-03-15: 30 mg via INTRAMUSCULAR
  Filled 2013-03-15: qty 2

## 2013-03-15 NOTE — ED Provider Notes (Signed)
Medical screening examination/treatment/procedure(s) were performed by non-physician practitioner and as supervising physician I was immediately available for consultation/collaboration.  Sunnie Nielsen, MD 03/15/13 [phone]

## 2013-03-15 NOTE — ED Provider Notes (Signed)
CSN: [id number]     Arrival date & time 03/14/13  2332 History   First MD Initiated Contact with Patient 03/15/13 0107     Chief Complaint  Patient presents with  . Muscle Pain   (Consider location/radiation/quality/duration/timing/severity/associated sxs/prior Treatment) Patient is a 42 y.o. female presenting with abdominal pain. The history is provided by the patient.  Abdominal Pain Pain location:  LLQ, RLQ and suprapubic Pain quality: aching and cramping   Pain radiates to:  Back Pain severity:  Severe (10/10) Onset quality:  Gradual Duration:  12 hours Timing:  Constant Progression:  Worsening Chronicity:  Recurrent Relieved by:  Nothing Ineffective treatments:  NSAIDs and acetaminophen Associated symptoms: vaginal bleeding   Associated symptoms: no chills, no diarrhea, no dysuria, no fever, no nausea and no vomiting    Maria Curry is a 42 y.o. female who presents to the ED with abdominal pain that started this morning with onset of menses. She took tylenol and motrin without relief. She has this pain every time with her period but is worse tonight. Past Medical History  Diagnosis Date  . Chronic back pain   . Anxiety   . Panic attacks   . GERD (gastroesophageal reflux disease)    Past Surgical History  Procedure Laterality Date  . Orif facial fracture      pt has plates to left side of face  . Tubal ligation     Family History  Problem Relation Age of Onset  . Heart disease Mother   . Anuerysm Father   . Epilepsy Sister   . Cirrhosis Brother   . Heart disease Brother   . Epilepsy Brother    History  Substance Use Topics  . Smoking status: Current Some Day Smoker    Types: Cigarettes  . Smokeless tobacco: Not on file  . Alcohol Use: Yes     Comment: occasionally   OB History   Grav Para Term Preterm Abortions TAB SAB Ect Mult Living                 Review of Systems  Constitutional: Negative for fever and chills.  Gastrointestinal: Positive for  abdominal pain. Negative for nausea, vomiting and diarrhea.  Genitourinary: Positive for vaginal bleeding. Negative for dysuria, urgency and frequency.  Musculoskeletal: Positive for back pain. Negative for myalgias.  Skin: Negative for rash.  Neurological: Negative for syncope and headaches.  Psychiatric/Behavioral: The patient is not nervous/anxious.     Allergies  Bee venom and Ibuprofen  Home Medications   Current Outpatient Rx  Name  Route  Sig  Dispense  Refill  . acetaminophen (TYLENOL) 500 MG tablet   Oral   Take 1,000 mg by mouth daily as needed for pain.         . meloxicam (MOBIC) 7.5 MG tablet   Oral   Take 1 tablet (7.5 mg total) by mouth daily.   10 tablet   0    BP 116/80  Pulse 81  Temp(Src) 98.3 F (36.8 C) (Oral)  Resp 20  Ht 5\' 2"  (1.575 m)  Wt 150 lb (68.04 kg)  BMI 27.43 kg/m2  SpO2 100%  LMP 03/14/2013 Physical Exam  Nursing note and vitals reviewed. Constitutional: She is oriented to person, place, and time. She appears well-developed and well-nourished. No distress.  HENT:  Head: Normocephalic and atraumatic.  Eyes: EOM are normal.  Neck: Neck supple.  Cardiovascular: Normal rate and regular rhythm.   Pulmonary/Chest: Effort normal and breath sounds normal.  Abdominal: Soft. Bowel sounds are normal.  Minimal tenderness on exam. Patient states pain comes and goes like severe cramps.  Genitourinary:  External genitalia without lesions. Moderate blood vaginal vault. No CMT no adnexal tenderness. Uterus without palpable enlargement.  Musculoskeletal: Normal range of motion.  Neurological: She is alert and oriented to person, place, and time. No cranial nerve deficit.  Skin: Skin is warm and dry.  Psychiatric: She has a normal mood and affect. Her behavior is normal.    ED Course  Procedures Results for orders placed during the hospital encounter of 03/15/13 (from the past 24 hour(s))  URINALYSIS, ROUTINE W REFLEX MICROSCOPIC     Status:  Abnormal   Collection Time    03/15/13  2:20 AM      Result Value Range   Color, Urine YELLOW  YELLOW   APPearance CLEAR  CLEAR   Specific Gravity, Urine 1.010  1.005 - 1.030   pH 7.0  5.0 - 8.0   Glucose, UA NEGATIVE  NEGATIVE mg/dL   Hgb urine dipstick LARGE (*) NEGATIVE   Bilirubin Urine NEGATIVE  NEGATIVE   Ketones, ur NEGATIVE  NEGATIVE mg/dL   Protein, ur NEGATIVE  NEGATIVE mg/dL   Urobilinogen, UA 0.2  0.0 - 1.0 mg/dL   Nitrite NEGATIVE  NEGATIVE   Leukocytes, UA NEGATIVE  NEGATIVE  URINE MICROSCOPIC-ADD ON     Status: Abnormal   Collection Time    03/15/13  2:20 AM      Result Value Range   Squamous Epithelial / LPF FEW (*) RARE   WBC, UA 0-2  <3 WBC/hpf   RBC / HPF 3-6  <3 RBC/hpf   Bacteria, UA RARE  RARE  CBC WITH DIFFERENTIAL     Status: None   Collection Time    03/15/13  2:36 AM      Result Value Range   WBC 10.3  4.0 - 10.5 K/uL   RBC 4.00  3.87 - 5.11 MIL/uL   Hemoglobin 12.8  12.0 - 15.0 g/dL   HCT 62.1  30.8 - 65.7 %   MCV 93.5  78.0 - 100.0 fL   MCH 32.0  26.0 - 34.0 pg   MCHC 34.2  30.0 - 36.0 g/dL   RDW 84.6  96.2 - 95.2 %   Platelets 205  150 - 400 K/uL   Neutrophils Relative % 57  43 - 77 %   Neutro Abs 5.9  1.7 - 7.7 K/uL   Lymphocytes Relative 34  12 - 46 %   Lymphs Abs 3.5  0.7 - 4.0 K/uL   Monocytes Relative 5  3 - 12 %   Monocytes Absolute 0.5  0.1 - 1.0 K/uL   Eosinophils Relative 4  0 - 5 %   Eosinophils Absolute 0.4  0.0 - 0.7 K/uL   Basophils Relative 0  0 - 1 %   Basophils Absolute 0.0  0.0 - 0.1 K/uL  COMPREHENSIVE METABOLIC PANEL     Status: Abnormal   Collection Time    03/15/13  2:36 AM      Result Value Range   Sodium 138  135 - 145 mEq/L   Potassium 3.8  3.5 - 5.1 mEq/L   Chloride 104  96 - 112 mEq/L   CO2 25  19 - 32 mEq/L   Glucose, Bld 101 (*) 70 - 99 mg/dL   BUN 8  6 - 23 mg/dL   Creatinine, Ser 8.41  0.50 - 1.10 mg/dL   Calcium 8.9  8.4 - 10.5 mg/dL   Total Protein 6.4  6.0 - 8.3 g/dL   Albumin 3.5  3.5 - 5.2 g/dL    AST 22  0 - 37 U/L   ALT 45 (*) 0 - 35 U/L   Alkaline Phosphatase 74  39 - 117 U/L   Total Bilirubin 0.2 (*) 0.3 - 1.2 mg/dL   GFR calc non Af Amer >90  >90 mL/min   GFR calc Af Amer >90  >90 mL/min    MDM  42 y.o. female with dysmenorrhea. Will treat pain and have her follow up with GYN. Patient stable for discharge home without any immediate complications.  I have reviewed this patient's vital signs, nurses notes, appropriate labs and discussed findings and plan of care with the patient and she voices understanding.    Medication List    TAKE these medications       HYDROcodone-acetaminophen 5-325 MG per tablet  Commonly known as:  NORCO/VICODIN  Take 1 tablet by mouth every 4 (four) hours as needed.      ASK your doctor about these medications       acetaminophen 500 MG tablet  Commonly known as:  TYLENOL  Take 1,000 mg by mouth daily as needed for pain.     meloxicam 7.5 MG tablet  Commonly known as:  MOBIC  Take 1 tablet (7.5 mg total) by mouth daily.         [address] [place], [place] 03/15/13 [phone]

## 2013-03-15 NOTE — ED Notes (Signed)
Pain all over, menstrual cramps,

## 2013-03-16 LAB — GC/CHLAMYDIA PROBE AMP
CT Probe RNA: NEGATIVE
GC Probe RNA: NEGATIVE

## 2013-04-27 ENCOUNTER — Emergency Department (HOSPITAL_COMMUNITY)
Admission: EM | Admit: 2013-04-27 | Discharge: 2013-04-27 | Disposition: A | Discharge status: Home / Self Care | Payer: Medicaid Other | Attending: Emergency Medicine | Admitting: Emergency Medicine

## 2013-04-27 ENCOUNTER — Encounter (HOSPITAL_COMMUNITY): Payer: Self-pay | Admitting: Emergency Medicine

## 2013-04-27 DIAGNOSIS — R062 Wheezing: Secondary | ICD-10-CM | POA: Insufficient documentation

## 2013-04-27 DIAGNOSIS — Z791 Long term (current) use of non-steroidal anti-inflammatories (NSAID): Secondary | ICD-10-CM | POA: Insufficient documentation

## 2013-04-27 DIAGNOSIS — G8929 Other chronic pain: Secondary | ICD-10-CM | POA: Insufficient documentation

## 2013-04-27 DIAGNOSIS — Z8719 Personal history of other diseases of the digestive system: Secondary | ICD-10-CM | POA: Insufficient documentation

## 2013-04-27 DIAGNOSIS — M549 Dorsalgia, unspecified: Secondary | ICD-10-CM | POA: Insufficient documentation

## 2013-04-27 DIAGNOSIS — Z8659 Personal history of other mental and behavioral disorders: Secondary | ICD-10-CM | POA: Insufficient documentation

## 2013-04-27 DIAGNOSIS — F17200 Nicotine dependence, unspecified, uncomplicated: Secondary | ICD-10-CM | POA: Insufficient documentation

## 2013-04-27 MED ORDER — OXYCODONE-ACETAMINOPHEN 5-325 MG PO TABS
1.0000 | ORAL_TABLET | Freq: Once | ORAL | Status: AC
  Administered 2013-04-27: 1 via ORAL
  Filled 2013-04-27: qty 1

## 2013-04-27 MED ORDER — PREDNISONE (PAK) 10 MG PO TABS: 10.0000 mg | ORAL_TABLET | Freq: Every day | ORAL | Status: DC

## 2013-04-27 MED ORDER — PREDNISONE 50 MG PO TABS
60.0000 mg | ORAL_TABLET | Freq: Once | ORAL | Status: AC
  Administered 2013-04-27: 18:00:00 60 mg via ORAL
  Filled 2013-04-27 (×2): qty 1

## 2013-04-27 MED ORDER — CYCLOBENZAPRINE HCL 10 MG PO TABS: 10.0000 mg | ORAL_TABLET | Freq: Two times a day (BID) | ORAL | Status: DC | PRN

## 2013-04-27 NOTE — ED Notes (Signed)
Back pain x 3 days 

## 2013-04-27 NOTE — ED Provider Notes (Signed)
CSN: [id number]     Arrival date & time 04/27/13  1619 History   First MD Initiated Contact with Patient 04/27/13 1801     Chief Complaint  Patient presents with  . Back Pain   (Consider location/radiation/quality/duration/timing/severity/associated sxs/prior Treatment) Patient is a 42 y.o. female presenting with back pain. The history is provided by the patient.  Back Pain Location:  Generalized Quality:  Aching Radiates to:  L thigh and R thigh Pain severity:  Severe Onset quality:  Gradual Duration:  3 days Timing:  Constant Progression:  Worsening Chronicity:  Chronic Context: physical stress   Relieved by:  Nothing Worsened by:  Movement, standing, twisting, coughing and bending Ineffective treatments:  None tried Associated symptoms: leg pain   Associated symptoms: no abdominal pain, no bladder incontinence, no bowel incontinence, no chest pain, no dysuria, no fever and no pelvic pain    Maria Curry is a 42 y.o. female who presents to the ED with back pain that started 3 days ago. She has not taken anything for pain because she has no money. She took her son today for a colonoscopy and had to help him back in the house when they got home and after that the pain in her back was severe. She describes the pain as aching, burning that starts on both sides of her neck and goes down her back and into the back of her legs.   Past Medical History  Diagnosis Date  . Chronic back pain   . Anxiety   . Panic attacks   . GERD (gastroesophageal reflux disease)    Past Surgical History  Procedure Laterality Date  . Orif facial fracture      pt has plates to left side of face  . Tubal ligation     Family History  Problem Relation Age of Onset  . Heart disease Mother   . Anuerysm Father   . Epilepsy Sister   . Cirrhosis Brother   . Heart disease Brother   . Epilepsy Brother    History  Substance Use Topics  . Smoking status: Current Some Day Smoker    Types: Cigarettes    . Smokeless tobacco: Not on file  . Alcohol Use: Yes     Comment: occasionally   OB History   Grav Para Term Preterm Abortions TAB SAB Ect Mult Living                 Review of Systems  Constitutional: Negative for fever.  Eyes: Positive for itching. Negative for visual disturbance.  Respiratory: Negative for cough and wheezing.   Cardiovascular: Negative for chest pain.  Gastrointestinal: Negative for nausea, vomiting, abdominal pain and bowel incontinence.  Genitourinary: Negative for bladder incontinence, dysuria and pelvic pain.  Musculoskeletal: Positive for back pain.  Skin: Negative for rash.  Allergic/Immunologic: Negative for immunocompromised state.  Neurological: Negative for syncope.  Psychiatric/Behavioral: The patient is not nervous/anxious.     Allergies  Bee venom and Ibuprofen  Home Medications   Current Outpatient Rx  Name  Route  Sig  Dispense  Refill  . acetaminophen (TYLENOL) 500 MG tablet   Oral   Take 1,000 mg by mouth daily as needed for pain.         Marland Kitchen HYDROcodone-acetaminophen (NORCO/VICODIN) 5-325 MG per tablet   Oral   Take 1 tablet by mouth every 4 (four) hours as needed.   15 tablet   0   . meloxicam (MOBIC) 7.5 MG tablet  Oral   Take 1 tablet (7.5 mg total) by mouth daily.   10 tablet   0    BP 109/77  Pulse 94  Temp(Src) 97.7 F (36.5 C) (Oral)  Resp 14  Ht 5\' 2"  (1.575 m)  Wt 150 lb (68.04 kg)  BMI 27.43 kg/m2  SpO2 94%  LMP 04/02/2013 Physical Exam  Nursing note and vitals reviewed. Constitutional: She is oriented to person, place, and time. She appears well-developed and well-nourished. No distress.  HENT:  Head: Normocephalic and atraumatic.  Eyes: Conjunctivae and EOM are normal. Pupils are equal, round, and reactive to light.  Neck: Neck supple.  Cardiovascular: Normal rate, regular rhythm and normal heart sounds.   Pulmonary/Chest: Effort normal. She has wheezes.  Abdominal: Soft. Bowel sounds are normal.  There is no tenderness.  Musculoskeletal:       Cervical back: She exhibits tenderness and spasm. She exhibits normal range of motion, no deformity, no laceration and normal pulse.       Back:  Pedal pulses equal bilateral, adequate circulation, good touch sensation. Straight leg raises without difficulty. Patient can bend and touch toes without difficulty.  Neurological: She is alert and oriented to person, place, and time. She has normal strength and normal reflexes. No cranial nerve deficit or sensory deficit. Gait normal.  Skin: Skin is warm and dry.  Psychiatric: She has a normal mood and affect. Her behavior is normal.    ED Course  Procedures  MDM  42 y.o. female with chronic back pain with worsening today after helping her son into the house after a procedure. Has not taken anything for pain due to lack of money to buy any medication. Will give Flexeril and Prednisone. Discussed with the patient need for follow up with PCP.  Discussed with the patient and all questioned fully answered.   Medication List    TAKE these medications       cyclobenzaprine 10 MG tablet  Commonly known as:  FLEXERIL  Take 1 tablet (10 mg total) by mouth 2 (two) times daily as needed for muscle spasms.     predniSONE 10 MG tablet  Commonly known as:  STERAPRED UNI-PAK  Take 1 tablet (10 mg total) by mouth daily. Starting 04/28/2013 take 5 tablets PO followed by 4, 3, 2, 1      ASK your doctor about these medications       acetaminophen 500 MG tablet  Commonly known as:  TYLENOL  Take 1,000 mg by mouth daily as needed for pain.     HYDROcodone-acetaminophen 5-325 MG per tablet  Commonly known as:  NORCO/VICODIN  Take 1 tablet by mouth every 4 (four) hours as needed.     meloxicam 7.5 MG tablet  Commonly known as:  MOBIC  Take 1 tablet (7.5 mg total) by mouth daily.         Janne [place], [place] 04/27/13 [phone]

## 2013-04-27 NOTE — ED Provider Notes (Signed)
Medical screening examination/treatment/procedure(s) were performed by non-physician practitioner and as supervising physician I was immediately available for consultation/collaboration.  Jeromiah Ohalloran, MD 04/27/13 2318 

## 2013-07-13 ENCOUNTER — Telehealth: Payer: Self-pay | Admitting: Nurse Practitioner

## 2013-07-13 ENCOUNTER — Ambulatory Visit (INDEPENDENT_AMBULATORY_CARE_PROVIDER_SITE_OTHER): Payer: Medicaid Other | Admitting: Nurse Practitioner

## 2013-07-13 ENCOUNTER — Encounter: Payer: Self-pay | Admitting: Nurse Practitioner

## 2013-07-13 VITALS — BP 118/77 | HR 80 | Temp 99.0°F | Ht 62.0 in | Wt 152.0 lb

## 2013-07-13 DIAGNOSIS — R509 Fever, unspecified: Secondary | ICD-10-CM

## 2013-07-13 DIAGNOSIS — J069 Acute upper respiratory infection, unspecified: Secondary | ICD-10-CM

## 2013-07-13 LAB — POCT INFLUENZA A/B
Influenza A, POC: NEGATIVE
Influenza B, POC: NEGATIVE

## 2013-07-13 MED ORDER — SERTRALINE HCL 50 MG PO TABS: 50.0000 mg | ORAL_TABLET | Freq: Every day | ORAL | Status: DC

## 2013-07-13 NOTE — Progress Notes (Signed)
   Subjective:    Patient ID: Maria Curry, female    DOB: 12/26/1970, 43 y.o.   MRN: [id number]  HPI Patient in c/o all over body ache- sore throat- low grade fever. Started yesterday afternoon. She is also c/o stress. Says that she feels like she is loosing her mind. She is staying at home taking care of her sick brother ( cirrhosis of the liver )    Review of Systems  Constitutional: Negative for fever, chills and appetite change.  HENT: Positive for congestion, sinus pressure and sore throat.   Respiratory: Positive for cough.   Cardiovascular: Negative.   Gastrointestinal: Negative.   Genitourinary: Negative.        Objective:   Physical Exam  Constitutional: She is oriented to person, place, and time. She appears well-developed and well-nourished.  HENT:  Right Ear: Hearing, tympanic membrane, external ear and ear canal normal.  Left Ear: Hearing, tympanic membrane, external ear and ear canal normal.  Nose: Mucosal edema and rhinorrhea present. Right sinus exhibits no maxillary sinus tenderness and no frontal sinus tenderness. Left sinus exhibits no maxillary sinus tenderness and no frontal sinus tenderness.  Mouth/Throat: Uvula is midline. Posterior oropharyngeal erythema (mild) present.  Cardiovascular: Normal rate, regular rhythm and normal heart sounds.   Pulmonary/Chest: Effort normal and breath sounds normal. She has no wheezes. She has no rales.  Neurological: She is alert and oriented to person, place, and time.  Skin: Skin is warm and dry.  Psychiatric: She has a normal mood and affect. Her behavior is normal. Judgment and thought content normal.   BP 118/77  Pulse 80  Temp(Src) 99 F (37.2 C) (Oral)  Ht 5\' 2"  (1.575 m)  Wt 152 lb (68.947 kg)  BMI 27.79 kg/m2   Results for orders placed in visit on 07/13/13  POCT INFLUENZA A/B      Result Value Range   Influenza A, POC Negative     Influenza B, POC Negative          Assessment & Plan:   1. Fever,  unspecified   2. Upper respiratory infection    Orders Placed This Encounter  Procedures  . POCT Influenza A/B   Meds ordered this encounter  Medications  . sertraline (ZOLOFT) 50 MG tablet    Sig: Take 1 tablet (50 mg total) by mouth daily.    Dispense:  30 tablet    Refill:  3    Order Specific Question:  Supervising Provider    Answer:  Deborra MedinaMOORE, DONALD W [[id number]]    Stress management 1. Take meds as prescribed 2. Use a cool mist humidifier especially during the winter months and when heat has  been humid. 3. Use saline nose sprays frequently 4. Saline irrigations of the nose can be very helpful if done frequently.  * 4X daily for 1 week*  * Use of a nettie pot can be helpful with this. Follow directions with this* 5. Drink plenty of fluids 6. Keep thermostat turn down low 7.For any cough or congestion  Use plain Mucinex- regular strength or max strength is fine   * Children- consult with Pharmacist for dosing 8. For fever or aces or pains- take tylenol or ibuprofen appropriate for age and weight.  * for fevers greater than 101 orally you may alternate ibuprofen and tylenol every  3 hours.   Mary-Margaret Daphine DeutscherMartin, FNP

## 2013-07-13 NOTE — Patient Instructions (Addendum)
Generalized Anxiety Disorder Generalized anxiety disorder (GAD) is a mental disorder. It interferes with life functions, including relationships, work, and school. GAD is different from normal anxiety, which everyone experiences at some point in their lives in response to specific life events and activities. Normal anxiety actually helps us prepare for and get through these life events and activities. Normal anxiety goes away after the event or activity is over.  GAD causes anxiety that is not necessarily related to specific events or activities. It also causes excess anxiety in proportion to specific events or activities. The anxiety associated with GAD is also difficult to control. GAD can vary from mild to severe. People with severe GAD can have intense waves of anxiety with physical symptoms (panic attacks).  SYMPTOMS The anxiety and worry associated with GAD are difficult to control. This anxiety and worry are related to many life events and activities and also occur more days than not for 6 months or longer. People with GAD also have three or more of the following symptoms (one or more in children):  Restlessness.   Fatigue.  Difficulty concentrating.   Irritability.  Muscle tension.  Difficulty sleeping or unsatisfying sleep. DIAGNOSIS GAD is diagnosed through an assessment by your caregiver. Your caregiver will ask you questions aboutyour mood,physical symptoms, and events in your life. Your caregiver may ask you about your medical history and use of alcohol or drugs, including prescription medications. Your caregiver may also do a physical exam and blood tests. Certain medical conditions and the use of certain substances can cause symptoms similar to those associated with GAD. Your caregiver may refer you to a mental health specialist for further evaluation. TREATMENT The following therapies are usually used to treat GAD:   Medication Antidepressant medication usually is  prescribed for long-term daily control. Antianxiety medications may be added in severe cases, especially when panic attacks occur.   Talk therapy (psychotherapy) Certain types of talk therapy can be helpful in treating GAD by providing support, education, and guidance. A form of talk therapy called cognitive behavioral therapy can teach you healthy ways to think about and react to daily life events and activities.  Stress managementtechniques These include yoga, meditation, and exercise and can be very helpful when they are practiced regularly. A mental health specialist can help determine which treatment is best for you. Some people see improvement with one therapy. However, other people require a combination of therapies. Document Released: 10/20/2012 Document Reviewed: 10/20/2012 [facility] Patient Information 2014 [employer] [place] [place].   1. Take meds as prescribed 2. Use a cool mist humidifier especially during the winter months and when heat has  been humid. 3. Use saline nose sprays frequently 4. Saline irrigations of the nose can be very helpful if done frequently.  * 4X daily for 1 week*  * Use of a nettie pot can be helpful with this. Follow directions with this* 5. Drink plenty of fluids 6. Keep thermostat turn down low 7.For any cough or congestion  Use plain Mucinex- regular strength or max strength is fine   * Children- consult with Pharmacist for dosing 8. For fever or aces or pains- take tylenol or ibuprofen appropriate for age and weight.  * for fevers greater than 101 orally you may alternate ibuprofen and tylenol every  3 hours.

## 2013-07-13 NOTE — Telephone Encounter (Signed)
Appt given for today 

## 2013-07-14 ENCOUNTER — Telehealth: Payer: Self-pay | Admitting: Nurse Practitioner

## 2013-07-14 NOTE — Telephone Encounter (Signed)
NTBS to get tramadol- control substance

## 2013-07-15 ENCOUNTER — Encounter: Payer: Self-pay | Admitting: Nurse Practitioner

## 2013-07-15 ENCOUNTER — Ambulatory Visit (INDEPENDENT_AMBULATORY_CARE_PROVIDER_SITE_OTHER): Payer: Medicaid Other | Admitting: Nurse Practitioner

## 2013-07-15 VITALS — BP 119/75 | HR 94 | Temp 100.1°F | Ht 62.0 in | Wt 145.0 lb

## 2013-07-15 DIAGNOSIS — F32A Depression, unspecified: Secondary | ICD-10-CM

## 2013-07-15 DIAGNOSIS — F3289 Other specified depressive episodes: Secondary | ICD-10-CM

## 2013-07-15 DIAGNOSIS — J40 Bronchitis, not specified as acute or chronic: Secondary | ICD-10-CM

## 2013-07-15 DIAGNOSIS — R509 Fever, unspecified: Secondary | ICD-10-CM

## 2013-07-15 DIAGNOSIS — F411 Generalized anxiety disorder: Secondary | ICD-10-CM | POA: Insufficient documentation

## 2013-07-15 DIAGNOSIS — F329 Major depressive disorder, single episode, unspecified: Secondary | ICD-10-CM | POA: Insufficient documentation

## 2013-07-15 DIAGNOSIS — R52 Pain, unspecified: Secondary | ICD-10-CM

## 2013-07-15 LAB — POCT INFLUENZA A/B
INFLUENZA A, POC: NEGATIVE
INFLUENZA B, POC: NEGATIVE

## 2013-07-15 MED ORDER — METHYLPREDNISOLONE ACETATE 80 MG/ML IJ SUSP
80.0000 mg | Freq: Once | INTRAMUSCULAR | Status: AC
  Administered 2013-07-15: 80 mg via INTRAMUSCULAR

## 2013-07-15 NOTE — Patient Instructions (Signed)

## 2013-07-15 NOTE — Progress Notes (Signed)
   Subjective:    Patient ID: Maria Curry, female    DOB: 02/06/1971, 43 y.o.   MRN: [id number]  HPI Patient was seen Monday with cough and congestion- flu was negative- now c/o body aches and no better. Only taken tylenol OTC. Wants Ultram    Review of Systems  Constitutional: Negative for fever, chills and appetite change.  HENT: Positive for congestion.   Respiratory: Positive for cough.   Cardiovascular: Negative.   Gastrointestinal: Negative.   Genitourinary: Negative.   Musculoskeletal: Negative.        Objective:   Physical Exam  Constitutional: She is oriented to person, place, and time. She appears well-developed and well-nourished.  HENT:  Head: Normocephalic.  Right Ear: External ear normal.  Left Ear: External ear normal.  Nose: Nose normal.  Mouth/Throat: Oropharynx is clear and moist.  Eyes: EOM are normal. Pupils are equal, round, and reactive to light.  Neck: Normal range of motion. Neck supple.  Cardiovascular: Normal rate and normal heart sounds.   Pulmonary/Chest: Effort normal. She has wheezes (faint insp wheeze bil).  Deep dry cough  Lymphadenopathy:    She has no cervical adenopathy.  Neurological: She is alert and oriented to person, place, and time.  Skin: Skin is warm and dry.  Psychiatric: She has a normal mood and affect. Her behavior is normal. Judgment and thought content normal.   BP 119/75  Pulse 94  Temp(Src) 100.1 F (37.8 C) (Oral)  Ht 5\' 2"  (1.575 m)  Wt 145 lb (65.772 kg)  BMI 26.51 kg/m2  Results for orders placed in visit on 07/15/13  POCT INFLUENZA A/B      Result Value Range   Influenza A, POC Negative     Influenza B, POC Negative           Assessment & Plan:   1. Fever, unspecified   2. GAD (generalized anxiety disorder)   3. Depression   4. Bronchitis   5. Body aches    Orders Placed This Encounter  Procedures  . POCT Influenza A/B   Meds ordered this encounter  Medications  . methylPREDNISolone  acetate (DEPO-MEDROL) injection 80 mg    Sig:    1. Take meds as prescribed 2. Use a cool mist humidifier especially during the winter months and when heat has  been humid. 3. Use saline nose sprays frequently 4. Saline irrigations of the nose can be very helpful if done frequently.  * 4X daily for 1 week*  * Use of a nettie pot can be helpful with this. Follow directions with this* 5. Drink plenty of fluids 6. Keep thermostat turn down low 7.For any cough or congestion  Use plain Mucinex- regular strength or max strength is fine   * Children- consult with Pharmacist for dosing 8. For fever or aces or pains- take tylenol or ibuprofen appropriate for age and weight.  * for fevers greater than 101 orally you may alternate ibuprofen and tylenol every  3 hours.   Mary-Margaret Daphine DeutscherMartin, FNP

## 2013-07-15 NOTE — Telephone Encounter (Signed)
Appointment made today with mmm

## 2013-07-20 ENCOUNTER — Telehealth: Payer: Self-pay | Admitting: Nurse Practitioner

## 2013-07-20 NOTE — Telephone Encounter (Signed)
Need to do motrin OTC

## 2013-07-21 NOTE — Telephone Encounter (Signed)
PT NOTIFIED AND VERBALIZED UNDERSTANDING. 

## 2013-11-03 ENCOUNTER — Other Ambulatory Visit: Payer: Self-pay | Admitting: Nurse Practitioner

## 2013-11-04 NOTE — Telephone Encounter (Signed)
Last ov 1/15. 

## 2013-11-08 ENCOUNTER — Encounter (HOSPITAL_COMMUNITY): Payer: Self-pay | Admitting: Emergency Medicine

## 2013-11-08 ENCOUNTER — Emergency Department (HOSPITAL_COMMUNITY)
Admission: EM | Admit: 2013-11-08 | Discharge: 2013-11-08 | Disposition: A | Discharge status: Home / Self Care | Payer: Medicaid Other | Attending: Emergency Medicine | Admitting: Emergency Medicine

## 2013-11-08 DIAGNOSIS — M545 Low back pain, unspecified: Secondary | ICD-10-CM | POA: Insufficient documentation

## 2013-11-08 DIAGNOSIS — Z8719 Personal history of other diseases of the digestive system: Secondary | ICD-10-CM | POA: Insufficient documentation

## 2013-11-08 DIAGNOSIS — Z79899 Other long term (current) drug therapy: Secondary | ICD-10-CM | POA: Insufficient documentation

## 2013-11-08 DIAGNOSIS — R52 Pain, unspecified: Secondary | ICD-10-CM | POA: Insufficient documentation

## 2013-11-08 DIAGNOSIS — G8929 Other chronic pain: Secondary | ICD-10-CM | POA: Insufficient documentation

## 2013-11-08 DIAGNOSIS — M542 Cervicalgia: Secondary | ICD-10-CM | POA: Insufficient documentation

## 2013-11-08 DIAGNOSIS — M546 Pain in thoracic spine: Secondary | ICD-10-CM | POA: Insufficient documentation

## 2013-11-08 DIAGNOSIS — F17200 Nicotine dependence, unspecified, uncomplicated: Secondary | ICD-10-CM | POA: Insufficient documentation

## 2013-11-08 DIAGNOSIS — M5380 Other specified dorsopathies, site unspecified: Secondary | ICD-10-CM | POA: Insufficient documentation

## 2013-11-08 DIAGNOSIS — F410 Panic disorder [episodic paroxysmal anxiety] without agoraphobia: Secondary | ICD-10-CM | POA: Insufficient documentation

## 2013-11-08 DIAGNOSIS — M549 Dorsalgia, unspecified: Secondary | ICD-10-CM

## 2013-11-08 MED ORDER — MELOXICAM 7.5 MG PO TABS: 7.5000 mg | ORAL_TABLET | Freq: Every day | ORAL | Status: DC

## 2013-11-08 MED ORDER — CYCLOBENZAPRINE HCL 10 MG PO TABS
10.0000 mg | ORAL_TABLET | Freq: Once | ORAL | Status: AC
  Administered 2013-11-08: 10 mg via ORAL
  Filled 2013-11-08: qty 1

## 2013-11-08 MED ORDER — CYCLOBENZAPRINE HCL 10 MG PO TABS: 10.0000 mg | ORAL_TABLET | Freq: Two times a day (BID) | ORAL | Status: DC | PRN

## 2013-11-08 MED ORDER — OXYCODONE-ACETAMINOPHEN 5-325 MG PO TABS
1.0000 | ORAL_TABLET | Freq: Once | ORAL | Status: AC
  Administered 2013-11-08: 1 via ORAL
  Filled 2013-11-08: qty 1

## 2013-11-08 NOTE — ED Provider Notes (Signed)
CSN: [id number]     Arrival date & time 11/08/13  1933 History   First MD Initiated Contact with Patient 11/08/13 2029     Chief Complaint  Patient presents with  . Back Pain     (Consider location/radiation/quality/duration/timing/severity/associated sxs/prior Treatment) Patient is a 43 y.o. female presenting with back pain. The history is provided by the patient.  Back Pain Location:  Generalized Quality:  Aching Pain severity:  Severe Pain is:  Same all the time Onset quality:  Gradual Duration:  12 hours Timing:  Constant Associated symptoms: leg pain   Associated symptoms: no abdominal pain, no bladder incontinence, no bowel incontinence, no chest pain, no dysuria, no fever and no headaches    Maria Curry is a 43 y.o. female who presents to the ED with generalized back pain that started yesterday. She states that she has a history of chronic back pain. She does not recall any injury or doing anything different that caused the pain. She denies loss of control of bladder or bowels. The pain starts in the upper back and goes to the buttocks. She denies UTI symptoms, nausea, vomiting, fever or chills. She denies numbness of tingling of arms or legs.   Past Medical History  Diagnosis Date  . Chronic back pain   . Anxiety   . Panic attacks   . GERD (gastroesophageal reflux disease)    Past Surgical History  Procedure Laterality Date  . Orif facial fracture      pt has plates to left side of face  . Tubal ligation     Family History  Problem Relation Age of Onset  . Heart disease Mother   . Anuerysm Father   . Epilepsy Sister   . Cirrhosis Brother   . Heart disease Brother   . Epilepsy Brother    History  Substance Use Topics  . Smoking status: Current Some Day Smoker    Types: Cigarettes  . Smokeless tobacco: Not on file  . Alcohol Use: Yes     Comment: occasionally   OB History   Grav Para Term Preterm Abortions TAB SAB Ect Mult Living                  Review of Systems  Constitutional: Negative for fever and chills.  HENT: Negative.   Eyes: Negative for visual disturbance.  Respiratory: Negative for cough and shortness of breath.   Cardiovascular: Negative for chest pain.  Gastrointestinal: Negative for nausea, vomiting, abdominal pain and bowel incontinence.  Genitourinary: Negative for bladder incontinence, dysuria, urgency, frequency and hematuria.  Musculoskeletal: Positive for back pain and neck pain. Negative for neck stiffness.  Skin: Negative for rash and wound.  Neurological: Negative for syncope and headaches.  Psychiatric/Behavioral: Negative for confusion. Nervous/anxious: hx of anxiety and panic attacks.       Allergies  Bee venom and Ibuprofen  Home Medications   Prior to Admission medications   Medication Sig Start Date End Date Taking? Authorizing Provider  acetaminophen (TYLENOL) 500 MG tablet Take 1,000 mg by mouth daily as needed for pain.    Historical Provider, MD  sertraline (ZOLOFT) 50 MG tablet TAKE 1 TABLET BY MOUTH DAILY    Maria Daphine DeutscherMartin, FNP   BP 115/75  Pulse 95  Temp(Src) 97.9 F (36.6 C) (Oral)  Resp 20  Ht 5\' 2"  (1.575 m)  Wt 146 lb 3.2 oz (66.316 kg)  BMI 26.73 kg/m2  SpO2 100%  LMP 11/08/2013 Physical Exam  Nursing note and  vitals reviewed. Constitutional: She is oriented to person, place, and time. She appears well-developed and well-nourished. No distress.  HENT:  Head: Normocephalic and atraumatic.  Right Ear: Tympanic membrane normal.  Left Ear: Tympanic membrane normal.  Nose: Nose normal.  Mouth/Throat: Uvula is midline, oropharynx is clear and moist and mucous membranes are normal.  Eyes: Conjunctivae and EOM are normal.  Neck: Normal range of motion. Neck supple.  Cardiovascular: Normal rate, regular rhythm and normal heart sounds.   Pulmonary/Chest: Effort normal and breath sounds normal. She has no wheezes. She has no rales.  Abdominal: Soft. Bowel sounds are  normal. There is no tenderness.  Musculoskeletal: Normal range of motion.       Thoracic back: She exhibits tenderness. She exhibits normal range of motion, no deformity and normal pulse.       Lumbar back: She exhibits tenderness, pain and spasm. She exhibits normal pulse.       Back:  Generalized tenderness upper and lower back. Full range of motion, pedal and radial pulses strong, adequate circulation, good touch sensation. Ambulatory with steady gait without foot drag.   Neurological: She is alert and oriented to person, place, and time. She has normal strength. No cranial nerve deficit or sensory deficit. Gait normal.  Reflex Scores:      Bicep reflexes are 2+ on the right side and 2+ on the left side.      Brachioradialis reflexes are 2+ on the right side and 2+ on the left side.      Patellar reflexes are 2+ on the right side and 2+ on the left side.      Achilles reflexes are 2+ on the right side and 2+ on the left side. Skin: Skin is warm and dry.  Psychiatric: She has a normal mood and affect. Her behavior is normal.    ED Course  Procedures  Pain management, muscle relaxant.  MDM  43 y.o. female with chronic back pain with acute flair up. Stable for discharge with normal neuro exam. She will follow up with her PCP or return here for worsening symptoms. Discussed with the patient clinical findings and plan of care and all questioned fully answered. She will return if any problems arise.    Medication List    TAKE these medications       cyclobenzaprine 10 MG tablet  Commonly known as:  FLEXERIL  Take 1 tablet (10 mg total) by mouth 2 (two) times daily as needed for muscle spasms.     meloxicam 7.5 MG tablet  Commonly known as:  MOBIC  Take 1 tablet (7.5 mg total) by mouth daily.      ASK your doctor about these medications       acetaminophen 500 MG tablet  Commonly known as:  TYLENOL  Take 1,000 mg by mouth daily as needed for pain.     sertraline 50 MG tablet    Commonly known as:  ZOLOFT  TAKE 1 TABLET BY MOUTH DAILY           SalisburyHope M Lailynn Southgate, NP 11/09/13 [phone]

## 2013-11-08 NOTE — ED Notes (Signed)
Patient complaining of back pain. Reports starts at top of back and moves down to knees.

## 2013-11-08 NOTE — Discharge Instructions (Signed)
It is important that you follow up with your doctor for chronic pain management. Take the medications as directed. Do not take the medication if you are driving as it will make you sleepy.

## 2013-11-11 NOTE — ED Provider Notes (Signed)
Medical screening examination/treatment/procedure(s) were performed by non-physician practitioner and as supervising physician I was immediately available for consultation/collaboration.   Amauri Keefe M Rhythm Wigfall, MD 11/11/13 2207 

## 2014-01-30 ENCOUNTER — Encounter (HOSPITAL_COMMUNITY): Payer: Self-pay | Admitting: Emergency Medicine

## 2014-01-30 ENCOUNTER — Emergency Department (HOSPITAL_COMMUNITY)
Admission: EM | Admit: 2014-01-30 | Discharge: 2014-01-30 | Disposition: A | Discharge status: Home / Self Care | Payer: Medicaid Other | Attending: Emergency Medicine | Admitting: Emergency Medicine

## 2014-01-30 ENCOUNTER — Emergency Department (HOSPITAL_COMMUNITY): Payer: Medicaid Other

## 2014-01-30 DIAGNOSIS — Z79899 Other long term (current) drug therapy: Secondary | ICD-10-CM | POA: Diagnosis not present

## 2014-01-30 DIAGNOSIS — F17200 Nicotine dependence, unspecified, uncomplicated: Secondary | ICD-10-CM | POA: Insufficient documentation

## 2014-01-30 DIAGNOSIS — Z8719 Personal history of other diseases of the digestive system: Secondary | ICD-10-CM | POA: Insufficient documentation

## 2014-01-30 DIAGNOSIS — M542 Cervicalgia: Secondary | ICD-10-CM | POA: Diagnosis not present

## 2014-01-30 DIAGNOSIS — F410 Panic disorder [episodic paroxysmal anxiety] without agoraphobia: Secondary | ICD-10-CM | POA: Insufficient documentation

## 2014-01-30 DIAGNOSIS — Z791 Long term (current) use of non-steroidal anti-inflammatories (NSAID): Secondary | ICD-10-CM | POA: Diagnosis not present

## 2014-01-30 DIAGNOSIS — G8929 Other chronic pain: Secondary | ICD-10-CM | POA: Diagnosis not present

## 2014-01-30 MED ORDER — HYDROCODONE-ACETAMINOPHEN 5-325 MG PO TABS: 1.0000 | ORAL_TABLET | ORAL | Status: DC | PRN

## 2014-01-30 MED ORDER — CYCLOBENZAPRINE HCL 10 MG PO TABS: 10.0000 mg | ORAL_TABLET | Freq: Two times a day (BID) | ORAL | Status: DC | PRN

## 2014-01-30 MED ORDER — CYCLOBENZAPRINE HCL 10 MG PO TABS
10.0000 mg | ORAL_TABLET | Freq: Once | ORAL | Status: AC
  Administered 2014-01-30: 10 mg via ORAL

## 2014-01-30 MED ORDER — CYCLOBENZAPRINE HCL 10 MG PO TABS
ORAL_TABLET | ORAL | Status: AC
  Filled 2014-01-30: qty 1

## 2014-01-30 MED ORDER — OXYCODONE-ACETAMINOPHEN 5-325 MG PO TABS
1.0000 | ORAL_TABLET | Freq: Once | ORAL | Status: AC
  Administered 2014-01-30: 1 via ORAL
  Filled 2014-01-30: qty 1

## 2014-01-30 NOTE — ED Notes (Signed)
Pain in neck, huts to move, states she just started a new job and she began to hurt. 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

## 2014-01-30 NOTE — ED Provider Notes (Signed)
CSN: [id number]     Arrival date & time 01/30/14  1951 History   First MD Initiated Contact with Patient 01/30/14 2120     Chief Complaint  Patient presents with  . Neck Pain     (Consider location/radiation/quality/duration/timing/severity/associated sxs/prior Treatment) Patient is a 43 y.o. female presenting with neck pain. The history is provided by the patient.  Neck Pain Pain radiates to:  L shoulder and R shoulder Pain severity:  Severe Onset quality:  Gradual Duration:  1 month Timing:  Constant Progression:  Worsening Chronicity:  New Context: not recent injury   Relieved by:  Nothing Exacerbated by: movement of neck. Ineffective treatments:  NSAIDs Associated symptoms: no bladder incontinence and no bowel incontinence    Gypsy Balsamracy T Uhrich is a 43 y.o. female who presents to the ED with neck pain that started over a month ago. She started a new job last week and the pain has gotten worse. She has a history of chronic low back pain and it continues to hurt as well. She likes her new job and wants to keep it but needs something for pain.   Past Medical History  Diagnosis Date  . Chronic back pain   . Anxiety   . Panic attacks   . GERD (gastroesophageal reflux disease)    Past Surgical History  Procedure Laterality Date  . Orif facial fracture      pt has plates to left side of face  . Tubal ligation     Family History  Problem Relation Age of Onset  . Heart disease Mother   . Anuerysm Father   . Epilepsy Sister   . Cirrhosis Brother   . Heart disease Brother   . Epilepsy Brother    History  Substance Use Topics  . Smoking status: Current Some Day Smoker    Types: Cigarettes  . Smokeless tobacco: Not on file  . Alcohol Use: Yes     Comment: occasionally   OB History   Grav Para Term Preterm Abortions TAB SAB Ect Mult Living                 Review of Systems  Gastrointestinal: Negative for bowel incontinence.  Genitourinary: Negative for bladder  incontinence.  Musculoskeletal: Positive for back pain (chronic) and neck pain.  All other systems negative    Allergies  Bee venom and Ibuprofen  Home Medications   Prior to Admission medications   Medication Sig Start Date End Date Taking? Authorizing Provider  acetaminophen (TYLENOL) 500 MG tablet Take 1,000 mg by mouth daily as needed for pain.    Historical Provider, MD  cyclobenzaprine (FLEXERIL) 10 MG tablet Take 1 tablet (10 mg total) by mouth 2 (two) times daily as needed for muscle spasms. 11/08/13   Treshun Wold Orlene OchM Cordella Nyquist, NP  meloxicam (MOBIC) 7.5 MG tablet Take 1 tablet (7.5 mg total) by mouth daily. 11/08/13   Marshella Tello Orlene OchM Emory Gallentine, NP  sertraline (ZOLOFT) 50 MG tablet TAKE 1 TABLET BY MOUTH DAILY    Mary-Margaret Daphine DeutscherMartin, FNP   BP 116/49  Pulse 98  Temp(Src) 98.4 F (36.9 C)  Resp 20  Ht 5\' 2"  (1.575 m)  Wt 156 lb (70.761 kg)  BMI 28.53 kg/m2  SpO2 100% Physical Exam  Nursing note and vitals reviewed. Constitutional: She is oriented to person, place, and time. She appears well-developed and well-nourished. No distress.  HENT:  Head: Normocephalic and atraumatic.  Right Ear: Tympanic membrane normal.  Left Ear: Tympanic membrane normal.  Nose:  Nose normal.  Mouth/Throat: Uvula is midline, oropharynx is clear and moist and mucous membranes are normal.  Eyes: EOM are normal.  Neck: Normal range of motion. Neck supple.  Cardiovascular: Normal rate and regular rhythm.   Pulmonary/Chest: Effort normal. She has no wheezes. She has no rales.  Abdominal: Soft. Bowel sounds are normal. There is no tenderness.  Musculoskeletal: Normal range of motion.       Cervical back: She exhibits tenderness, bony tenderness and spasm. She exhibits normal range of motion and normal pulse.       Lumbar back: She exhibits pain (that is chronic). She exhibits normal pulse.  Radial pulses equal, good grips, adequate circulation. Pain with palpation over the cervical spine that radiates to the shoulders.  Muscle spasm appreciated.   Neurological: She is alert and oriented to person, place, and time. She has normal strength. No cranial nerve deficit or sensory deficit. Gait normal.  Reflex Scores:      Bicep reflexes are 2+ on the right side and 2+ on the left side.      Brachioradialis reflexes are 2+ on the right side and 2+ on the left side.      Patellar reflexes are 2+ on the right side and 2+ on the left side.      Achilles reflexes are 2+ on the right side and 2+ on the left side. Skin: Skin is warm and dry.  Psychiatric: She has a normal mood and affect. Her behavior is normal.    ED Course  Procedures  Ct Cervical Spine Wo Contrast  01/30/2014   CLINICAL DATA:  Bilateral neck pain and numbness in both upper extremities.  EXAM: CT CERVICAL SPINE WITHOUT CONTRAST  TECHNIQUE: Multidetector CT imaging of the cervical spine was performed without intravenous contrast. Multiplanar CT image reconstructions were also generated.  COMPARISON:  MRI of the cervical spine performed 04/07/2012  FINDINGS: There is no evidence of acute fracture or subluxation. There is a chronic non-healed fracture involving the right posterior arch of C1, unchanged from 2003. Vertebral bodies demonstrate normal height and alignment. There is minimal narrowing of the intervertebral disc space at C4-C5, with small anterior and posterior disc osteophyte complexes at this level. Prevertebral soft tissues are within normal limits. The visualized neural foramina are grossly unremarkable.  The thyroid gland is unremarkable in appearance. The visualized lung apices are clear. No significant soft tissue abnormalities are seen. There is partial opacification of the left side of the sphenoid sinus.  IMPRESSION: 1. No evidence of acute fracture or subluxation along the cervical spine. 2. 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 3. Partial opacification of the left side of the  sphenoid sinus.   Electronically Signed   By: Roanna Raider M.D.   On: 01/30/2014 22:47    MDM  43 y.o. female with cervical strain. I discussed with the patient that we will treat her pain and muscle spasm tonight and until she can follow up with her PCP on Monday. However, the ED is not the place for chronic pain management. Encouraged patient to discuss chronic pain and pain management with her PCP. She agrees with plan. Stable for discharge without neuro deficits.    Medication List    TAKE these medications       cyclobenzaprine 10 MG tablet  Commonly known as:  FLEXERIL  Take 1 tablet (10 mg total) by mouth 2 (two) times daily as needed for muscle spasms.  HYDROcodone-acetaminophen 5-325 MG per tablet  Commonly known as:  NORCO/VICODIN  Take 1 tablet by mouth every 4 (four) hours as needed.      ASK your doctor about these medications       acetaminophen 500 MG tablet  Commonly known as:  TYLENOL  Take 1,000 mg by mouth every 4 (four) hours as needed for mild pain or moderate pain.     sertraline 50 MG tablet  Commonly known as:  ZOLOFT  Take 50 mg by mouth every morning.           [address] [place], [place] 01/31/14 [phone]

## 2014-01-30 NOTE — ED Notes (Signed)
Patient with no complaints at this time. Respirations even and unlabored. Skin warm/dry. Discharge instructions reviewed with patient at this time. Patient given opportunity to voice concerns/ask questions. Patient discharged at this time and left Emergency Department with steady gait.   

## 2014-01-30 NOTE — Discharge Instructions (Signed)
We are giving you pain medication until you can see your primary care doctor on Monday. Do not take the medication if your are driving because they will make you sleepy.  Cervical Sprain A cervical sprain is when the tissues (ligaments) that hold the neck bones in place stretch or tear. HOME CARE   Put ice on the injured area.  Put ice in a plastic bag.  Place a towel between your skin and the bag.  Leave the ice on for 15-20 minutes, 3-4 times a day.  You may have been given a collar to wear. This collar keeps your neck from moving while you heal.  Do not take the collar off unless told by your doctor.  If you have long hair, keep it outside of the collar.  Ask your doctor before changing the position of your collar. You may need to change its position over time to make it more comfortable.  If you are allowed to take off the collar for cleaning or bathing, follow your doctor's instructions on how to do it safely.  Keep your collar clean by wiping it with mild soap and water. Dry it completely. If the collar has removable pads, remove them every 1-2 days to hand wash them with soap and water. Allow them to air dry. They should be dry before you wear them in the collar.  Do not drive while wearing the collar.  Only take medicine as told by your doctor.  Keep all doctor visits as told.  Keep all physical therapy visits as told.  Adjust your work station so that you have good posture while you work.  Avoid positions and activities that make your problems worse.  Warm up and stretch before being active. GET HELP IF:  Your pain is not controlled with medicine.  You cannot take less pain medicine over time as planned.  Your activity level does not improve as expected. GET HELP RIGHT AWAY IF:   You are bleeding.  Your stomach is upset.  You have an allergic reaction to your medicine.  You develop new problems that you cannot explain.  You lose feeling (become numb)  or you cannot move any part of your body (paralysis).  You have tingling or weakness in any part of your body.  Your symptoms get worse. Symptoms include:  Pain, soreness, stiffness, puffiness (swelling), or a burning feeling in your neck.  Pain when your neck is touched.  Shoulder or upper back pain.  Limited ability to move your neck.  Headache.  Dizziness.  Your hands or arms feel week, lose feeling, or tingle.  Muscle spasms.  Difficulty swallowing or chewing. MAKE SURE YOU:   Understand these instructions.  Will watch your condition.  Will get help right away if you are not doing well or get worse. Document Released: 12/12/2007 Document Revised: 02/25/2013 Document Reviewed: 12/31/2012 [facility] Patient Information 2015 [employer] [place]. This information is not intended to replace advice given to you by your health care provider. Make sure you discuss any questions you have with your health care provider.

## 2014-02-01 NOTE — ED Provider Notes (Signed)
Medical screening examination/treatment/procedure(s) were performed by non-physician practitioner and as supervising physician I was immediately available for consultation/collaboration.   EKG Interpretation None       Donnetta HutchingBrian Simmie Camerer, MD 02/01/14 [phone]

## 2014-02-06 ENCOUNTER — Emergency Department (HOSPITAL_COMMUNITY)
Admission: EM | Admit: 2014-02-06 | Discharge: 2014-02-06 | Disposition: A | Discharge status: Home / Self Care | Payer: Medicaid Other | Attending: Emergency Medicine | Admitting: Emergency Medicine

## 2014-02-06 ENCOUNTER — Encounter (HOSPITAL_COMMUNITY): Payer: Self-pay | Admitting: Emergency Medicine

## 2014-02-06 DIAGNOSIS — M545 Low back pain, unspecified: Secondary | ICD-10-CM | POA: Diagnosis not present

## 2014-02-06 DIAGNOSIS — Z791 Long term (current) use of non-steroidal anti-inflammatories (NSAID): Secondary | ICD-10-CM | POA: Diagnosis not present

## 2014-02-06 DIAGNOSIS — Z79899 Other long term (current) drug therapy: Secondary | ICD-10-CM | POA: Insufficient documentation

## 2014-02-06 DIAGNOSIS — F17200 Nicotine dependence, unspecified, uncomplicated: Secondary | ICD-10-CM | POA: Diagnosis not present

## 2014-02-06 DIAGNOSIS — Z8719 Personal history of other diseases of the digestive system: Secondary | ICD-10-CM | POA: Insufficient documentation

## 2014-02-06 DIAGNOSIS — G8929 Other chronic pain: Secondary | ICD-10-CM | POA: Diagnosis not present

## 2014-02-06 DIAGNOSIS — M549 Dorsalgia, unspecified: Secondary | ICD-10-CM | POA: Insufficient documentation

## 2014-02-06 DIAGNOSIS — Z8659 Personal history of other mental and behavioral disorders: Secondary | ICD-10-CM | POA: Diagnosis not present

## 2014-02-06 MED ORDER — METHOCARBAMOL 500 MG PO TABS
1000.0000 mg | ORAL_TABLET | Freq: Once | ORAL | Status: AC
  Administered 2014-02-06: 1000 mg via ORAL
  Filled 2014-02-06: qty 2

## 2014-02-06 MED ORDER — METHOCARBAMOL 500 MG PO TABS: 1000.0000 mg | ORAL_TABLET | Freq: Four times a day (QID) | ORAL | Status: AC

## 2014-02-06 MED ORDER — HYDROCODONE-ACETAMINOPHEN 5-325 MG PO TABS
1.0000 | ORAL_TABLET | Freq: Once | ORAL | Status: AC
  Administered 2014-02-06: 1 via ORAL
  Filled 2014-02-06: qty 1

## 2014-02-06 MED ORDER — MELOXICAM 7.5 MG PO TABS: 7.5000 mg | ORAL_TABLET | Freq: Every day | ORAL | Status: DC

## 2014-02-06 NOTE — ED Notes (Deleted)
Pt c/o sharp right side cp that radiates to back with slight sob at times. Nad noted.

## 2014-02-06 NOTE — ED Provider Notes (Signed)
CSN: [id number]     Arrival date & time 02/06/14  1800 History   First MD Initiated Contact with Patient 02/06/14 1818     Chief Complaint  Patient presents with  . Back Pain     (Consider location/radiation/quality/duration/timing/severity/associated sxs/prior Treatment) The history is provided by the patient.    Maria Curry is a 43 y.o. female presenting with acute on chronic low back pain which has which has been worsened for the past several days.   Patient denies any new injury specifically.  There is radiation of pain into her lower extremities, but denies weakness or numbness, additionally no urinary or bowel retention or incontinence.  Patient does not have a history of cancer or IVDU.  The patient has tried the followed medicines and/or treatments without relief of pain: tylenol.     Past Medical History  Diagnosis Date  . Chronic back pain   . Anxiety   . Panic attacks   . GERD (gastroesophageal reflux disease)    Past Surgical History  Procedure Laterality Date  . Orif facial fracture      pt has plates to left side of face  . Tubal ligation     Family History  Problem Relation Age of Onset  . Heart disease Mother   . Anuerysm Father   . Epilepsy Sister   . Cirrhosis Brother   . Heart disease Brother   . Epilepsy Brother    History  Substance Use Topics  . Smoking status: Current Some Day Smoker    Types: Cigarettes  . Smokeless tobacco: Not on file  . Alcohol Use: Yes     Comment: occasionally   OB History   Grav Para Term Preterm Abortions TAB SAB Ect Mult Living                 Review of Systems  Constitutional: Negative for fever.  Respiratory: Negative for shortness of breath.   Cardiovascular: Negative for chest pain and leg swelling.  Gastrointestinal: Negative for abdominal pain, constipation and abdominal distention.  Genitourinary: Negative for dysuria, urgency, frequency, flank pain and difficulty urinating.  Musculoskeletal: Positive  for back pain. Negative for gait problem and joint swelling.  Skin: Negative for rash.  Neurological: Negative for weakness and numbness.      Allergies  Bee venom and Ibuprofen  Home Medications   Prior to Admission medications   Medication Sig Start Date End Date Taking? Authorizing Provider  acetaminophen (TYLENOL) 500 MG tablet Take 1,000 mg by mouth every 4 (four) hours as needed for mild pain or moderate pain.    Yes Historical Provider, MD  cyclobenzaprine (FLEXERIL) 10 MG tablet Take 1 tablet (10 mg total) by mouth 2 (two) times daily as needed for muscle spasms. 01/30/14  Yes Hope Orlene Och, NP  sertraline (ZOLOFT) 50 MG tablet Take 50 mg by mouth every morning.   Yes Historical Provider, MD  meloxicam (MOBIC) 7.5 MG tablet Take 1 tablet (7.5 mg total) by mouth daily. 02/06/14   Burgess Amor, PA-C  methocarbamol (ROBAXIN) 500 MG tablet Take 2 tablets (1,000 mg total) by mouth 4 (four) times daily. 02/06/14 02/16/14  Burgess Amor, PA-C   BP 124/71  Pulse 100  Temp(Src) 98.8 F (37.1 C) (Oral)  Resp 16  Ht 5\' 2"  (1.575 m)  Wt 156 lb (70.761 kg)  BMI 28.53 kg/m2  SpO2 100%  LMP 12/07/2013 Physical Exam  Nursing note and vitals reviewed. Constitutional: She appears well-developed and well-nourished.  HENT:  Head: Normocephalic.  Eyes: Conjunctivae are normal.  Neck: Normal range of motion. Neck supple.  Cardiovascular: Normal rate and intact distal pulses.   Pedal pulses normal.  Pulmonary/Chest: Effort normal.  Abdominal: Soft. Bowel sounds are normal. She exhibits no distension and no mass.  Musculoskeletal: Normal range of motion. She exhibits no edema.       Lumbar back: She exhibits tenderness. She exhibits no swelling, no edema and no spasm.  Bilateral paralumbar tenderness.  No edema or deformity.  Neurological: She is alert. She has normal strength. She displays no atrophy and no tremor. No sensory deficit. Gait normal.  Reflex Scores:      Patellar reflexes are 2+ on  the right side and 2+ on the left side.      Achilles reflexes are 2+ on the right side and 2+ on the left side. No strength deficit noted in hip and knee flexor and extensor muscle groups.  Ankle flexion and extension intact.  Skin: Skin is warm and dry.  Psychiatric: She has a normal mood and affect.    ED Course  Procedures (including critical care time) Labs Review Labs Reviewed - No data to display  Imaging Review No results found.   EKG Interpretation None      MDM   Final diagnoses:  Chronic low back pain    No neuro deficit on exam or by history to suggest emergent or surgical presentation.  Also discussed worsened sx that should prompt immediate re-evaluation including distal weakness, bowel/bladder retention/incontinence.  Patient with a long-standing chronic back pain with no concerning neuro deficits on today's exam.  She was strongly encouraged followup with her PCP for discussion regarding chronic pain management which given her insurance needs to be referred by her PCP.  She was given suggestions of either Dr. Laurian Brim'Toole or Dr. Gerilyn Pilgrimoonquah as possible pain management physicians.  She was given a hydrocodone tablet here.  She will be placed on Robaxin and meloxicam.       Burgess AmorJulie Gabbi Whetstone, PA-C 02/06/14 1913

## 2014-02-06 NOTE — ED Notes (Signed)
Pt c/o generalized back pain radiating into both legs. Pt has h/s chronic back pain from severe fall >20 years ago.

## 2014-02-06 NOTE — Discharge Instructions (Signed)
Chronic Back Pain  When back pain lasts longer than 3 months, it is called chronic back pain.People with chronic back pain often go through certain periods that are more intense (flare-ups).  CAUSES Chronic back pain can be caused by wear and tear (degeneration) on different structures in your back. These structures include:  The bones of your spine (vertebrae) and the joints surrounding your spinal cord and nerve roots (facets).  The strong, fibrous tissues that connect your vertebrae (ligaments). Degeneration of these structures may result in pressure on your nerves. This can lead to constant pain. HOME CARE INSTRUCTIONS  Avoid bending, heavy lifting, prolonged sitting, and activities which make the problem worse.  Take brief periods of rest throughout the day to reduce your pain. Lying down or standing usually is better than sitting while you are resting.  Take over-the-counter or prescription medicines only as directed by your caregiver. SEEK IMMEDIATE MEDICAL CARE IF:   You have weakness or numbness in one of your legs or feet.  You have trouble controlling your bladder or bowels.  You have nausea, vomiting, abdominal pain, shortness of breath, or fainting. Document Released: 08/02/2004 Document Revised: 09/17/2011 Document Reviewed: 06/09/2011 [facility] Patient Information 2015 [employer] [place]. This information is not intended to replace advice given to you by your health care provider. Make sure you discuss any questions you have with your health care provider.   Take your medicines as directed.   Do not drive within 4 hours of taking robaxin as this may make you drowsy.  Avoid lifting,  Bending,  Twisting or any other activity that worsens your pain over the next week.  Apply an  icepack  to your lower back for 10-15 minutes every 2 hours for the next 2 days.  You should get rechecked if your symptoms are not better over the next 5 days,  Or you develop increased pain,  Weakness in  your leg(s) or loss of bladder or bowel function - these are symptoms of a worse injury.  As discussed,  You may benefit from chronic pain management.  Discuss referrals to either Dr Laurian Brim'Toole in [place] or Dr Gerilyn [place]quah [place] in [place].  This referral will need to come from your primary doctor.

## 2014-02-08 NOTE — ED Provider Notes (Signed)
History/physical exam/procedure(s) were performed by non-physician practitioner and as supervising physician I was immediately available for consultation/collaboration. I have reviewed all notes and am in agreement with care and plan.   Hilario Quarryanielle S Lyonel Morejon, MD 02/08/14 [phone]

## 2014-03-09 ENCOUNTER — Emergency Department (HOSPITAL_COMMUNITY)
Admission: EM | Admit: 2014-03-09 | Discharge: 2014-03-09 | Disposition: A | Discharge status: Home / Self Care | Payer: Medicaid Other | Attending: Emergency Medicine | Admitting: Emergency Medicine

## 2014-03-09 ENCOUNTER — Encounter (HOSPITAL_COMMUNITY): Payer: Self-pay | Admitting: Emergency Medicine

## 2014-03-09 DIAGNOSIS — Z79899 Other long term (current) drug therapy: Secondary | ICD-10-CM | POA: Diagnosis not present

## 2014-03-09 DIAGNOSIS — F410 Panic disorder [episodic paroxysmal anxiety] without agoraphobia: Secondary | ICD-10-CM | POA: Insufficient documentation

## 2014-03-09 DIAGNOSIS — G8929 Other chronic pain: Secondary | ICD-10-CM | POA: Diagnosis not present

## 2014-03-09 DIAGNOSIS — Z791 Long term (current) use of non-steroidal anti-inflammatories (NSAID): Secondary | ICD-10-CM | POA: Insufficient documentation

## 2014-03-09 DIAGNOSIS — F17200 Nicotine dependence, unspecified, uncomplicated: Secondary | ICD-10-CM | POA: Diagnosis not present

## 2014-03-09 DIAGNOSIS — Z8719 Personal history of other diseases of the digestive system: Secondary | ICD-10-CM | POA: Insufficient documentation

## 2014-03-09 DIAGNOSIS — M549 Dorsalgia, unspecified: Secondary | ICD-10-CM | POA: Diagnosis present

## 2014-03-09 MED ORDER — CYCLOBENZAPRINE HCL 10 MG PO TABS
10.0000 mg | ORAL_TABLET | Freq: Once | ORAL | Status: AC
  Administered 2014-03-09: 10 mg via ORAL
  Filled 2014-03-09: qty 1

## 2014-03-09 NOTE — ED Provider Notes (Signed)
Medical screening examination/treatment/procedure(s) were performed by non-physician practitioner and as supervising physician I was immediately available for consultation/collaboration.   EKG Interpretation None      Devoria Albe, MD, Armando Gang   Ward Givens, MD 03/09/14 [phone]

## 2014-03-09 NOTE — Discharge Instructions (Signed)
We are giving you the name of the Neurologist here in [place]  To follow up with for your back pain. Call tomorrow to make an appointment.

## 2014-03-09 NOTE — ED Notes (Signed)
Pt states pain starts "in neck and goes to my feet" and feels like "pin pricks" to the neck."  Pt has taken tylenol today and had not had relief.  States has had pain on and off for a year.

## 2014-03-09 NOTE — ED Provider Notes (Signed)
CSN: [id number]     Arrival date & time 03/09/14  2151 History   First MD Initiated Contact with Patient 03/09/14 2159     Chief Complaint  Patient presents with  . Back Pain     (Consider location/radiation/quality/duration/timing/severity/associated sxs/prior Treatment) Patient is a 43 y.o. female presenting with back pain. The history is provided by the patient.  Back Pain Location:  Generalized Pain severity:  Moderate Pain is:  Same all the time Timing:  Constant Chronicity:  Chronic Relieved by:  Nothing Worsened by:  Movement Maria Curry is a 43 y.o. female who presents to the ED with generalized back pain. She states that the pain starts at her neck and goes all the way down her back the same as always.   She states she does not want to see her primary care doctor any longer because he does not treat her pain. On review of the patient's last visit she was given referrals to see either Dr. Cornell Barman or Dr. Gerilyn Pilgrim but the patient states she did not know she was to follow up with anyone other than her PCP.   Past Medical History  Diagnosis Date  . Chronic back pain   . Anxiety   . Panic attacks   . GERD (gastroesophageal reflux disease)    Past Surgical History  Procedure Laterality Date  . Orif facial fracture      pt has plates to left side of face  . Tubal ligation     Family History  Problem Relation Age of Onset  . Heart disease Mother   . Anuerysm Father   . Epilepsy Sister   . Cirrhosis Brother   . Heart disease Brother   . Epilepsy Brother    History  Substance Use Topics  . Smoking status: Current Some Day Smoker    Types: Cigarettes  . Smokeless tobacco: Not on file  . Alcohol Use: Yes     Comment: occasionally   OB History   Grav Para Term Preterm Abortions TAB SAB Ect Mult Living                 Review of Systems  Musculoskeletal: Positive for back pain (that starts at the neck and goes to the feet x 1 year).  all other systems  negative    Allergies  Bee venom and Ibuprofen  Home Medications   Prior to Admission medications   Medication Sig Start Date End Date Taking? Authorizing Provider  acetaminophen (TYLENOL) 500 MG tablet Take 1,000 mg by mouth every 4 (four) hours as needed for mild pain or moderate pain.     Historical Provider, MD  cyclobenzaprine (FLEXERIL) 10 MG tablet Take 1 tablet (10 mg total) by mouth 2 (two) times daily as needed for muscle spasms. 01/30/14   Donnavin Vandenbrink Orlene Och, NP  meloxicam (MOBIC) 7.5 MG tablet Take 1 tablet (7.5 mg total) by mouth daily. 02/06/14   Burgess Amor, PA-C  sertraline (ZOLOFT) 50 MG tablet Take 50 mg by mouth every morning.    Historical Provider, MD   BP 123/89  Pulse 104  Temp(Src) 98.7 F (37.1 C) (Oral)  Resp 14  Ht  (1.575 m)  Wt 161 lb 11.2 oz (73.347 kg)  BMI 29.57 kg/m2  SpO2 100%  LMP 01/06/2014 Physical Exam  Nursing note and vitals reviewed. Constitutional: She is oriented to person, place, and time. She appears well-developed and well-nourished. No distress.  Patient sleeping in exam room. I woke her to  get her history and do her exam.   HENT:  Head: Normocephalic and atraumatic.  Right Ear: Tympanic membrane normal.  Left Ear: Tympanic membrane normal.  Mouth/Throat: Uvula is midline and mucous membranes are normal.  Eyes: Conjunctivae and EOM are normal.  Neck: Normal range of motion. Neck supple.  Cardiovascular: Normal rate and regular rhythm.   Pulmonary/Chest: Effort normal and breath sounds normal.  Abdominal: Soft. Bowel sounds are normal. There is no tenderness.  Musculoskeletal: Normal range of motion.  Generalized tenderness of the back that starts at the neck and goes down her back and legs. She is ambulatory with steady gait and without foot drag. Pedal pulses equal, adequate circulation, good touch sensation  Neurological: She is alert and oriented to person, place, and time. She has normal strength. No cranial nerve deficit or  sensory deficit. Gait normal.  Reflex Scores:      Bicep reflexes are 2+ on the right side and 2+ on the left side.      Brachioradialis reflexes are 2+ on the right side and 2+ on the left side.      Patellar reflexes are 2+ on the right side and 2+ on the left side.      Achilles reflexes are 2+ on the right side and 2+ on the left side. Skin: Skin is warm and dry.  Psychiatric: She has a normal mood and affect. Her behavior is normal.    ED Course  Procedures  MDM  43 y.o. female with chronic back pain for the past year. I discussed with the patient that she needs to be in pain management. I discussed follow up she was given at last visit and will give her there information again. Stable for discharge without neuro deficits. Discussed with the patient plan of care and all questioned fully answered.   Janne Napoleon, [place] 03/09/14 2308

## 2014-03-10 ENCOUNTER — Telehealth: Payer: Self-pay | Admitting: Family Medicine

## 2014-03-10 DIAGNOSIS — M542 Cervicalgia: Secondary | ICD-10-CM

## 2014-03-11 NOTE — Telephone Encounter (Signed)
Left message that referral was placed.

## 2014-03-11 NOTE — Telephone Encounter (Signed)
referral made.

## 2014-04-07 ENCOUNTER — Other Ambulatory Visit: Payer: Self-pay | Admitting: Nurse Practitioner

## 2014-04-09 ENCOUNTER — Emergency Department (HOSPITAL_COMMUNITY)
Admission: EM | Admit: 2014-04-09 | Discharge: 2014-04-09 | Disposition: A | Discharge status: Home / Self Care | Payer: Medicaid Other | Attending: Emergency Medicine | Admitting: Emergency Medicine

## 2014-04-09 ENCOUNTER — Encounter (HOSPITAL_COMMUNITY): Payer: Self-pay | Admitting: Emergency Medicine

## 2014-04-09 DIAGNOSIS — Z79899 Other long term (current) drug therapy: Secondary | ICD-10-CM | POA: Diagnosis not present

## 2014-04-09 DIAGNOSIS — F410 Panic disorder [episodic paroxysmal anxiety] without agoraphobia: Secondary | ICD-10-CM | POA: Insufficient documentation

## 2014-04-09 DIAGNOSIS — Z720 Tobacco use: Secondary | ICD-10-CM | POA: Diagnosis not present

## 2014-04-09 DIAGNOSIS — Z8719 Personal history of other diseases of the digestive system: Secondary | ICD-10-CM | POA: Diagnosis not present

## 2014-04-09 DIAGNOSIS — M545 Low back pain: Secondary | ICD-10-CM | POA: Diagnosis not present

## 2014-04-09 DIAGNOSIS — Z791 Long term (current) use of non-steroidal anti-inflammatories (NSAID): Secondary | ICD-10-CM | POA: Diagnosis not present

## 2014-04-09 DIAGNOSIS — M549 Dorsalgia, unspecified: Secondary | ICD-10-CM | POA: Diagnosis present

## 2014-04-09 DIAGNOSIS — G8929 Other chronic pain: Secondary | ICD-10-CM | POA: Insufficient documentation

## 2014-04-09 MED ORDER — PROMETHAZINE HCL 25 MG PO TABS: 25.0000 mg | ORAL_TABLET | Freq: Four times a day (QID) | ORAL | Status: DC | PRN

## 2014-04-09 MED ORDER — OXYCODONE-ACETAMINOPHEN 5-325 MG PO TABS
1.0000 | ORAL_TABLET | Freq: Once | ORAL | Status: AC
  Administered 2014-04-09: 1 via ORAL
  Filled 2014-04-09: qty 1

## 2014-04-09 MED ORDER — METHOCARBAMOL 500 MG PO TABS: 500.0000 mg | ORAL_TABLET | Freq: Two times a day (BID) | ORAL | Status: DC

## 2014-04-09 MED ORDER — MELOXICAM 7.5 MG PO TABS: 7.5000 mg | ORAL_TABLET | Freq: Every day | ORAL | Status: DC

## 2014-04-09 NOTE — ED Notes (Signed)
Post neck and rt arm pain for 1-2 mos, legs hurt  .

## 2014-04-09 NOTE — ED Provider Notes (Signed)
CSN: [id number]     Arrival date & time 04/09/14  1425 History   First MD Initiated Contact with Patient 04/09/14 1609     No chief complaint on file.    (Consider location/radiation/quality/duration/timing/severity/associated sxs/prior Treatment) The history is provided by the patient.   Gypsy Balsamracy T Garvey is a 43 y.o. female who presents to the ED with neck and right arm pain that started over a month ago. The patient has had the pain off and on for a year. This episode started a couple days ago. Patient is trying to get in with Dr. Gerilyn Pilgrimoonquah but he has to have a referral from her PCP before he can start treatment. This is the same pain the patient has had off and on.   Past Medical History  Diagnosis Date  . Chronic back pain   . Anxiety   . Panic attacks   . GERD (gastroesophageal reflux disease)    Past Surgical History  Procedure Laterality Date  . Orif facial fracture      pt has plates to left side of face  . Tubal ligation     Family History  Problem Relation Age of Onset  . Heart disease Mother   . Anuerysm Father   . Epilepsy Sister   . Cirrhosis Brother   . Heart disease Brother   . Epilepsy Brother    History  Substance Use Topics  . Smoking status: Current Some Day Smoker    Types: Cigarettes  . Smokeless tobacco: Not on file  . Alcohol Use: Yes     Comment: occasionally   OB History   Grav Para Term Preterm Abortions TAB SAB Ect Mult Living                 Review of Systems Negative except as stated in HPI   Allergies  Bee venom and Ibuprofen  Home Medications   Prior to Admission medications   Medication Sig Start Date End Date Taking? Authorizing Provider  acetaminophen (TYLENOL) 500 MG tablet Take 1,000 mg by mouth every 4 (four) hours as needed for mild pain or moderate pain.     Historical Provider, MD  cyclobenzaprine (FLEXERIL) 10 MG tablet Take 1 tablet (10 mg total) by mouth 2 (two) times daily as needed for muscle spasms. 01/30/14   Hope Orlene OchM  Neese, NP  meloxicam (MOBIC) 7.5 MG tablet Take 1 tablet (7.5 mg total) by mouth daily. 02/06/14   Burgess AmorJulie Idol, PA-C  sertraline (ZOLOFT) 50 MG tablet Take 50 mg by mouth every morning.    Historical Provider, MD   BP 120/89  Pulse 104  Temp(Src) 97.6 F (36.4 C) (Oral)  Resp 16  Ht 5\' 2"  (1.575 m)  Wt 162 lb 8 oz (73.71 kg)  BMI 29.71 kg/m2  SpO2 100%  LMP 03/10/2014 Physical Exam  Nursing note and vitals reviewed. Constitutional: She is oriented to person, place, and time. She appears well-developed and well-nourished. No distress.  HENT:  Head: Normocephalic and atraumatic.  Right Ear: Tympanic membrane normal.  Left Ear: Tympanic membrane normal.  Nose: Nose normal.  Mouth/Throat: Uvula is midline, oropharynx is clear and moist and mucous membranes are normal.  Eyes: EOM are normal.  Neck: Normal range of motion. Neck supple.  Cardiovascular: Normal rate and regular rhythm.   Pulmonary/Chest: Effort normal. She has no wheezes. She has no rales.  Abdominal: Soft. Bowel sounds are normal. There is no tenderness.  Musculoskeletal: Normal range of motion.  Cervical back: She exhibits tenderness and spasm. She exhibits normal range of motion and normal pulse.       Lumbar back: She exhibits tenderness, pain and spasm. She exhibits normal pulse.       Back:  Neurological: She is alert and oriented to person, place, and time. She has normal strength. No cranial nerve deficit or sensory deficit. Gait normal.  Reflex Scores:      Bicep reflexes are 2+ on the right side and 2+ on the left side.      Brachioradialis reflexes are 2+ on the right side and 2+ on the left side.      Patellar reflexes are 2+ on the right side and 2+ on the left side.      Achilles reflexes are 2+ on the right side and 2+ on the left side. Skin: Skin is warm and dry.  Psychiatric: She has a normal mood and affect. Her behavior is normal.    ED Course  Procedures (including critical care time) Labs  Review  MDM  43 y.o. female with right arm pain that radiated to the right side of her neck and low back pain that has been off an on for the past year. She is trying to get established with Dr. Gerilyn Pilgrim but has to wait for her PCP to make the referral. I discussed with the patient chronic pain management and use of the ED. Will treat for the muscle spasm and inflammation and she will follow up with her PCP. Stable for discharge without neuro deficits.     [facility] Orlene Och, NP 04/10/14 0110

## 2014-04-12 NOTE — ED Provider Notes (Signed)
  Medical screening examination/treatment/procedure(s) were performed by non-physician practitioner and as supervising physician I was immediately available for consultation/collaboration.   EKG Interpretation None         Gerhard Munchobert Amira Podolak, MD 04/12/14 2355

## 2014-04-17 ENCOUNTER — Emergency Department (HOSPITAL_COMMUNITY)
Admission: EM | Admit: 2014-04-17 | Discharge: 2014-04-17 | Disposition: A | Discharge status: Home / Self Care | Payer: Medicaid Other | Attending: Emergency Medicine | Admitting: Emergency Medicine

## 2014-04-17 ENCOUNTER — Encounter (HOSPITAL_COMMUNITY): Payer: Self-pay | Admitting: Emergency Medicine

## 2014-04-17 ENCOUNTER — Emergency Department (HOSPITAL_COMMUNITY): Payer: Medicaid Other

## 2014-04-17 DIAGNOSIS — F410 Panic disorder [episodic paroxysmal anxiety] without agoraphobia: Secondary | ICD-10-CM | POA: Insufficient documentation

## 2014-04-17 DIAGNOSIS — Y9289 Other specified places as the place of occurrence of the external cause: Secondary | ICD-10-CM | POA: Insufficient documentation

## 2014-04-17 DIAGNOSIS — Z8719 Personal history of other diseases of the digestive system: Secondary | ICD-10-CM | POA: Diagnosis not present

## 2014-04-17 DIAGNOSIS — W1830XA Fall on same level, unspecified, initial encounter: Secondary | ICD-10-CM | POA: Insufficient documentation

## 2014-04-17 DIAGNOSIS — R55 Syncope and collapse: Secondary | ICD-10-CM

## 2014-04-17 DIAGNOSIS — Y939 Activity, unspecified: Secondary | ICD-10-CM | POA: Insufficient documentation

## 2014-04-17 DIAGNOSIS — G8929 Other chronic pain: Secondary | ICD-10-CM | POA: Insufficient documentation

## 2014-04-17 DIAGNOSIS — Z79899 Other long term (current) drug therapy: Secondary | ICD-10-CM | POA: Insufficient documentation

## 2014-04-17 LAB — HEPATIC FUNCTION PANEL
ALBUMIN: 4.1 g/dL (ref 3.5–5.2)
ALT: 18 U/L (ref 0–35)
AST: 13 U/L (ref 0–37)
Alkaline Phosphatase: 72 U/L (ref 39–117)
Bilirubin, Direct: <0.2 mg/dL (ref 0.0–0.3)
Total Bilirubin: 0.6 mg/dL (ref 0.3–1.2)
Total Protein: 7.3 g/dL (ref 6.0–8.3)

## 2014-04-17 LAB — URINALYSIS, ROUTINE W REFLEX MICROSCOPIC
Bilirubin Urine: NEGATIVE
GLUCOSE, UA: NEGATIVE mg/dL
Ketones, ur: NEGATIVE mg/dL
Leukocytes, UA: NEGATIVE
Nitrite: NEGATIVE
PH: 6 (ref 5.0–8.0)
Protein, ur: NEGATIVE mg/dL
SPECIFIC GRAVITY, URINE: 1.025 (ref 1.005–1.030)
Urobilinogen, UA: 0.2 mg/dL (ref 0.0–1.0)

## 2014-04-17 LAB — CBC WITH DIFFERENTIAL/PLATELET
BASOS ABS: 0 10*3/uL (ref 0.0–0.1)
Basophils Relative: 0 % (ref 0–1)
Eosinophils Absolute: 0.1 10*3/uL (ref 0.0–0.7)
Eosinophils Relative: 1 % (ref 0–5)
HEMATOCRIT: 39.9 % (ref 36.0–46.0)
Hemoglobin: 13.6 g/dL (ref 12.0–15.0)
LYMPHS PCT: 30 % (ref 12–46)
Lymphs Abs: 2.1 10*3/uL (ref 0.7–4.0)
MCH: 31.9 pg (ref 26.0–34.0)
MCHC: 34.1 g/dL (ref 30.0–36.0)
MCV: 93.4 fL (ref 78.0–100.0)
MONOS PCT: 8 % (ref 3–12)
Monocytes Absolute: 0.5 10*3/uL (ref 0.1–1.0)
Neutro Abs: 4.2 10*3/uL (ref 1.7–7.7)
Neutrophils Relative %: 61 % (ref 43–77)
Platelets: 226 10*3/uL (ref 150–400)
RBC: 4.27 MIL/uL (ref 3.87–5.11)
RDW: 13 % (ref 11.5–15.5)
WBC: 6.8 10*3/uL (ref 4.0–10.5)

## 2014-04-17 LAB — PREGNANCY, URINE: PREG TEST UR: NEGATIVE

## 2014-04-17 LAB — URINE MICROSCOPIC-ADD ON

## 2014-04-17 LAB — BASIC METABOLIC PANEL
ANION GAP: 13 (ref 5–15)
BUN: 6 mg/dL (ref 6–23)
CHLORIDE: 105 meq/L (ref 96–112)
CO2: 22 meq/L (ref 19–32)
CREATININE: 0.64 mg/dL (ref 0.50–1.10)
Calcium: 9.1 mg/dL (ref 8.4–10.5)
GFR calc Af Amer: >90 mL/min (ref >90)
GFR calc non Af Amer: >90 mL/min (ref >90)
Glucose, Bld: 109 mg/dL — ABNORMAL HIGH (ref 70–99)
Potassium: 4 mEq/L (ref 3.7–5.3)
Sodium: 140 mEq/L (ref 137–147)

## 2014-04-17 MED ORDER — SODIUM CHLORIDE 0.9 % IV BOLUS (SEPSIS)
500.0000 mL | Freq: Once | INTRAVENOUS | Status: AC
  Administered 2014-04-17: 500 mL via INTRAVENOUS

## 2014-04-17 MED ORDER — TRAMADOL HCL 50 MG PO TABS: 50.0000 mg | ORAL_TABLET | Freq: Four times a day (QID) | ORAL | Status: DC | PRN

## 2014-04-17 MED ORDER — ONDANSETRON 4 MG PO TBDP: ORAL_TABLET | ORAL | Status: DC

## 2014-04-17 NOTE — ED Provider Notes (Signed)
CSN: [id number]     Arrival date & time 04/17/14  1023 History   First MD Initiated Contact with Patient 04/17/14 1039     Chief Complaint  Patient presents with  . Loss of Consciousness     (Consider location/radiation/quality/duration/timing/severity/associated sxs/prior Treatment) Patient is a 43 y.o. female presenting with syncope. The history is provided by the patient (the pt states she became dizzy and passed out at work this am.  the pt just started working nights).  Loss of Consciousness Episode history:  Single Most recent episode:  Today Timing:  Rare Progression:  Resolved Chronicity:  New Context: not blood draw   Associated symptoms: no chest pain, no headaches and no seizures     Past Medical History  Diagnosis Date  . Chronic back pain   . Anxiety   . Panic attacks   . GERD (gastroesophageal reflux disease)    Past Surgical History  Procedure Laterality Date  . Orif facial fracture      pt has plates to left side of face  . Tubal ligation     Family History  Problem Relation Age of Onset  . Heart disease Mother   . Anuerysm Father   . Epilepsy Sister   . Cirrhosis Brother   . Heart disease Brother   . Epilepsy Brother    History  Substance Use Topics  . Smoking status: Current Some Day Smoker    Types: Cigarettes  . Smokeless tobacco: Not on file  . Alcohol Use: Yes     Comment: occasionally   OB History   Grav Para Term Preterm Abortions TAB SAB Ect Mult Living                 Review of Systems  Constitutional: Negative for appetite change and fatigue.  HENT: Negative for congestion, ear discharge and sinus pressure.   Eyes: Negative for discharge.  Respiratory: Negative for cough.   Cardiovascular: Positive for syncope. Negative for chest pain.  Gastrointestinal: Negative for abdominal pain and diarrhea.  Genitourinary: Negative for frequency and hematuria.  Musculoskeletal: Negative for back pain.  Skin: Negative for rash.   Neurological: Negative for seizures and headaches.  Psychiatric/Behavioral: Negative for hallucinations.      Allergies  Bee venom and Ibuprofen  Home Medications   Prior to Admission medications   Medication Sig Start Date End Date Taking? Authorizing Provider  acetaminophen (TYLENOL) 500 MG tablet Take 1,000 mg by mouth every 4 (four) hours as needed for mild pain or moderate pain.    Yes Historical Provider, MD  sertraline (ZOLOFT) 50 MG tablet Take 50 mg by mouth every morning.   Yes Historical Provider, MD  ondansetron (ZOFRAN ODT) 4 MG disintegrating tablet 4mg  ODT q4 hours prn nausea/vomit 04/17/14   Benny Lennert, MD  traMADol (ULTRAM) 50 MG tablet Take 1 tablet (50 mg total) by mouth every 6 (six) hours as needed for moderate pain. 04/17/14   Benny Lennert, MD   BP 117/70  Pulse 76  Temp(Src) 98.7 F (37.1 C) (Oral)  Resp 22  Ht 5\' 2"  (1.575 m)  Wt 155 lb (70.308 kg)  BMI 28.34 kg/m2  SpO2 99%  LMP 04/11/2014 Physical Exam  Constitutional: She is oriented to person, place, and time. She appears well-developed.  HENT:  Head: Normocephalic.  Eyes: Conjunctivae and EOM are normal. No scleral icterus.  Neck: Neck supple. No thyromegaly present.  Cardiovascular: Normal rate and regular rhythm.  Exam reveals no gallop and no  friction rub.   No murmur heard. Pulmonary/Chest: No stridor. She has no wheezes. She has no rales. She exhibits no tenderness.  Abdominal: She exhibits no distension. There is no tenderness. There is no rebound.  Musculoskeletal: Normal range of motion. She exhibits no edema.  Tender coccyx   Lymphadenopathy:    She has no cervical adenopathy.  Neurological: She is oriented to person, place, and time. She exhibits normal muscle tone. Coordination normal.  Skin: No rash noted. No erythema.  Psychiatric: She has a normal mood and affect. Her behavior is normal.    ED Course  Procedures (including critical care time) Labs Review Labs  Reviewed  BASIC METABOLIC PANEL - Abnormal; Notable for the following:    Glucose, Bld 109 (*)    All other components within normal limits  URINALYSIS, ROUTINE W REFLEX MICROSCOPIC - Abnormal; Notable for the following:    Hgb urine dipstick TRACE (*)    All other components within normal limits  CBC WITH DIFFERENTIAL  HEPATIC FUNCTION PANEL  PREGNANCY, URINE  URINE MICROSCOPIC-ADD ON    Imaging Review Dg Sacrum/coccyx  04/17/2014   CLINICAL DATA:  Patient fell onto concrete floor last night, currently with pain  EXAM: SACRUM AND COCCYX - 2+ VIEW  COMPARISON:  None.  FINDINGS: Frontal and lateral views were obtained. No fracture or diastases. Joint spaces appear intact. No demonstrable sacroiliitis.  IMPRESSION: No abnormality noted.   Electronically Signed   By: Bretta BangWilliam  Woodruff M.D.   On: 04/17/2014 12:13   Ct Head Wo Contrast  04/17/2014   CLINICAL DATA:  Patient fell following syncopal episode.  Neck pain  EXAM: CT HEAD WITHOUT CONTRAST  CT CERVICAL SPINE WITHOUT CONTRAST  TECHNIQUE: Multidetector CT imaging of the head and cervical spine was performed following the standard protocol without intravenous contrast. Multiplanar CT image reconstructions of the cervical spine were also generated.  COMPARISON:  Head CT Nov 16, 2001; cervical spine MR April 07, 2012  FINDINGS: CT HEAD FINDINGS  The ventricles are normal in size and configuration. There is no appreciable mass, hemorrhage, extra-axial fluid collection, or midline shift. Gray-white compartments appear normal. No acute infarct.  There is asymmetry in the lateral aspect of the medial right temporal bone compared to the left, a stable finding likely due to asymmetric prominence of the sigmoid sinus on the right compared to the left. There is no erosive change or bony destruction in this area. Bony calvarium appears intact. The mastoid air cells are clear.  CT CERVICAL SPINE FINDINGS  There is an apparent old fracture of the right  lateral C1 lamina with non fusion in this area. The borders of this area appear well corticated, suggesting chronic injury in this area. There is no apparent acute fracture or spondylolisthesis. Prevertebral soft tissues and predental space regions are normal.  There is moderate disc space narrowing at C4-5. Other disc spaces appear normal. There is no appreciable nerve root edema or effacement. No disc extrusion or stenosis.  IMPRESSION: CT head: Study within normal limits and stable compared to prior study. No intracranial mass, hemorrhage, or extra-axial fluid. No acute infarct.  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 Borders of this fracture appear well corticated consistent with old injury. No acute fracture or spondylolisthesis. Moderate disc space narrowing is noted at C4-5.   Electronically Signed   By: Bretta BangWilliam  Woodruff M.D.   On: 04/17/2014 12:23   Ct Cervical Spine Wo Contrast  04/17/2014  CLINICAL DATA:  Patient fell following syncopal episode.  Neck pain  EXAM: CT HEAD WITHOUT CONTRAST  CT CERVICAL SPINE WITHOUT CONTRAST  TECHNIQUE: Multidetector CT imaging of the head and cervical spine was performed following the standard protocol without intravenous contrast. Multiplanar CT image reconstructions of the cervical spine were also generated.  COMPARISON:  Head CT Nov 16, 2001; cervical spine MR April 07, 2012  FINDINGS: CT HEAD FINDINGS  The ventricles are normal in size and configuration. There is no appreciable mass, hemorrhage, extra-axial fluid collection, or midline shift. Gray-white compartments appear normal. No acute infarct.  There is asymmetry in the lateral aspect of the medial right temporal bone compared to the left, a stable finding likely due to asymmetric prominence of the sigmoid sinus on the right compared to the left. There is no erosive change or bony destruction in this area. Bony calvarium appears intact. The mastoid air  cells are clear.  CT CERVICAL SPINE FINDINGS  There is an apparent old fracture of the right lateral C1 lamina with non fusion in this area. The borders of this area appear well corticated, suggesting chronic injury in this area. There is no apparent acute fracture or spondylolisthesis. Prevertebral soft tissues and predental space regions are normal.  There is moderate disc space narrowing at C4-5. Other disc spaces appear normal. There is no appreciable nerve root edema or effacement. No disc extrusion or stenosis.  IMPRESSION: CT head: Study within normal limits and stable compared to prior study. No intracranial mass, hemorrhage, or extra-axial fluid. No acute infarct.  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 Borders of this fracture appear well corticated consistent with old injury. No acute fracture or spondylolisthesis. Moderate disc space narrowing is noted at C4-5.   Electronically Signed   By: Bretta BangWilliam  Woodruff M.D.   On: 04/17/2014 12:23     EKG Interpretation None      MDM   Final diagnoses:  Syncope, vasovagal        Benny LennertJoseph L Esaw Knippel, MD 04/17/14 [phone]

## 2014-04-17 NOTE — ED Notes (Signed)
Pt was at work last night when had a syncopal episode, denies hitting head.  States when woke up, both hands were contracting.  Pt reports feeling weak and tired.

## 2014-04-17 NOTE — Discharge Instructions (Signed)
Follow up with your md or Dr. Renard MatterMcInnis or Dr. Lodema HongSimpson for recheck in one week.

## 2014-04-20 ENCOUNTER — Encounter: Payer: Self-pay | Admitting: Nurse Practitioner

## 2014-04-20 ENCOUNTER — Ambulatory Visit (INDEPENDENT_AMBULATORY_CARE_PROVIDER_SITE_OTHER): Payer: Medicaid Other | Admitting: Nurse Practitioner

## 2014-04-20 VITALS — BP 127/88 | HR 70 | Temp 99.2°F | Ht 62.0 in | Wt 163.4 lb

## 2014-04-20 DIAGNOSIS — Z87898 Personal history of other specified conditions: Secondary | ICD-10-CM

## 2014-04-20 DIAGNOSIS — F329 Major depressive disorder, single episode, unspecified: Secondary | ICD-10-CM

## 2014-04-20 DIAGNOSIS — Z9189 Other specified personal risk factors, not elsewhere classified: Secondary | ICD-10-CM

## 2014-04-20 DIAGNOSIS — F32A Depression, unspecified: Secondary | ICD-10-CM

## 2014-04-20 MED ORDER — SERTRALINE HCL 50 MG PO TABS: 50.0000 mg | ORAL_TABLET | Freq: Every morning | ORAL | Status: DC

## 2014-04-20 NOTE — Patient Instructions (Signed)
Syncope °Syncope is a medical term for fainting or passing out. This means you lose consciousness and drop to the ground. People are generally unconscious for less than 5 minutes. You may have some muscle twitches for up to 15 seconds before waking up and returning to normal. Syncope occurs more often in older adults, but it can happen to anyone. While most causes of syncope are not dangerous, syncope can be a sign of a serious medical problem. It is important to seek medical care.  °CAUSES  °Syncope is caused by a sudden drop in blood flow to the brain. The specific cause is often not determined. Factors that can bring on syncope include: °· Taking medicines that lower blood pressure. °· Sudden changes in posture, such as standing up quickly. °· Taking more medicine than prescribed. °· Standing in one place for too long. °· Seizure disorders. °· Dehydration and excessive exposure to heat. °· Low blood sugar (hypoglycemia). °· Straining to have a bowel movement. °· Heart disease, irregular heartbeat, or other circulatory problems. °· Fear, emotional distress, seeing blood, or severe pain. °SYMPTOMS  °Right before fainting, you may: °· Feel dizzy or light-headed. °· Feel nauseous. °· See all white or all black in your field of vision. °· Have cold, clammy skin. °DIAGNOSIS  °Your health care provider will ask about your symptoms, perform a physical exam, and perform an electrocardiogram (ECG) to record the electrical activity of your heart. Your health care provider may also perform other heart or blood tests to determine the cause of your syncope which may include: °· Transthoracic echocardiogram (TTE). During echocardiography, sound waves are used to evaluate how blood flows through your heart. °· Transesophageal echocardiogram (TEE). °· Cardiac monitoring. This allows your health care provider to monitor your heart rate and rhythm in real time. °· Holter monitor. This is a portable device that records your  heartbeat and can help diagnose heart arrhythmias. It allows your health care provider to track your heart activity for several days, if needed. °· Stress tests by exercise or by giving medicine that makes the heart beat faster. °TREATMENT  °In most cases, no treatment is needed. Depending on the cause of your syncope, your health care provider may recommend changing or stopping some of your medicines. °HOME CARE INSTRUCTIONS °· Have someone stay with you until you feel stable. °· Do not drive, use machinery, or play sports until your health care provider says it is okay. °· Keep all follow-up appointments as directed by your health care provider. °· Lie down right away if you start feeling like you might faint. Breathe deeply and steadily. Wait until all the symptoms have passed. °· Drink enough fluids to keep your urine clear or pale yellow. °· If you are taking blood pressure or heart medicine, get up slowly and take several minutes to sit and then stand. This can reduce dizziness. °SEEK IMMEDIATE MEDICAL CARE IF:  °· You have a severe headache. °· You have unusual pain in the chest, abdomen, or back. °· You are bleeding from your mouth or rectum, or you have black or tarry stool. °· You have an irregular or very fast heartbeat. °· You have pain with breathing. °· You have repeated fainting or seizure-like jerking during an episode. °· You faint when sitting or lying down. °· You have confusion. °· You have trouble walking. °· You have severe weakness. °· You have vision problems. °If you fainted, call your local emergency services (911 in [place] Do not drive   yourself to the hospital.  °MAKE SURE YOU: °· Understand these instructions. °· Will watch your condition. °· Will get help right away if you are not doing well or get worse. °Document Released: 06/25/2005 Document Revised: 06/30/2013 Document Reviewed: 08/24/2011 °ExitCare® Patient Information ©2015 [employer]. This information is not intended to replace  advice given to you by your health care provider. Make sure you discuss any questions you have with your health care provider. ° °

## 2014-04-20 NOTE — Progress Notes (Signed)
   Subjective:    Patient ID: Maria Curry, female    DOB: 29-Jun-1971, 43 y.o.   MRN: [id number]  HPI Patient started a new job Friday night- She passed out at work- [facility] - all test that were done were negative. The Er Doctor diagnosed her with vasovagal syncope. She has not had an episode since- Needs note to go back to work. Sh e ran out of zoloft about 1 1/2 weeks ago and wonders if that is what made her feel dizzy.    Review of Systems  Constitutional: Negative.   HENT: Negative.   Respiratory: Negative.   Cardiovascular: Negative.   Genitourinary: Negative.   Neurological: Positive for dizziness.  Psychiatric/Behavioral: Negative.   All other systems reviewed and are negative.      Objective:   Physical Exam  Constitutional: She is oriented to person, place, and time. She appears well-developed and well-nourished.  Cardiovascular: Normal rate, regular rhythm and normal heart sounds.   Pulmonary/Chest: Effort normal and breath sounds normal.  Abdominal: Soft. Bowel sounds are normal.  Neurological: She is alert and oriented to person, place, and time.  Skin: Skin is warm.  Psychiatric: She has a normal mood and affect. Her behavior is normal. Judgment and thought content normal.   BP 127/88  Pulse 70  Temp(Src) 99.2 F (37.3 C) (Oral)  Ht 5\' 2"  (1.575 m)  Wt 163 lb 6.4 oz (74.118 kg)  BMI 29.88 kg/m2  LMP 04/11/2014        Assessment & Plan:   1. Hx of syncope Force fluids Released to return to work  2. Depression (emotion) Stress management Do  Not run out of meds in the future - sertraline (ZOLOFT) 50 MG tablet; Take 1 tablet (50 mg total) by mouth every morning.  Dispense: 30 tablet; Refill: 5   Mary-Margaret Daphine DeutscherMartin, FNP

## 2014-06-29 ENCOUNTER — Encounter: Payer: Self-pay | Admitting: Nurse Practitioner

## 2014-06-29 ENCOUNTER — Ambulatory Visit (INDEPENDENT_AMBULATORY_CARE_PROVIDER_SITE_OTHER): Payer: Medicaid Other | Admitting: Nurse Practitioner

## 2014-06-29 VITALS — BP 118/81 | HR 98 | Temp 98.2°F | Ht 62.0 in | Wt 164.0 lb

## 2014-06-29 DIAGNOSIS — G5602 Carpal tunnel syndrome, left upper limb: Secondary | ICD-10-CM

## 2014-06-29 DIAGNOSIS — G5603 Carpal tunnel syndrome, bilateral upper limbs: Secondary | ICD-10-CM

## 2014-06-29 DIAGNOSIS — G5601 Carpal tunnel syndrome, right upper limb: Secondary | ICD-10-CM

## 2014-06-29 MED ORDER — DICLOFENAC SODIUM 75 MG PO TBEC: 75.0000 mg | DELAYED_RELEASE_TABLET | Freq: Two times a day (BID) | ORAL | Status: DC

## 2014-06-29 NOTE — Patient Instructions (Signed)
Carpal Tunnel Syndrome The carpal tunnel is a narrow area located on the palm side of your wrist. The tunnel is formed by the wrist bones and ligaments. Nerves, blood vessels, and tendons pass through the carpal tunnel. Repeated wrist motion or certain diseases may cause swelling within the tunnel. This swelling pinches the main nerve in the wrist (median nerve) and causes the painful hand and arm condition called carpal tunnel syndrome. CAUSES   Repeated wrist motions.  Wrist injuries.  Certain diseases like arthritis, diabetes, alcoholism, hyperthyroidism, and kidney failure.  Obesity.  Pregnancy. SYMPTOMS   A "pins and needles" feeling in your fingers or hand, especially in your thumb, index and middle fingers.  Tingling or numbness in your fingers or hand.  An aching feeling in your entire arm, especially when your wrist and elbow are bent for long periods of time.  Wrist pain that goes up your arm to your shoulder.  Pain that goes down into your palm or fingers.  A weak feeling in your hands. DIAGNOSIS  Your health care provider will take your history and perform a physical exam. An electromyography test may be needed. This test measures electrical signals sent out by your nerves into the muscles. The electrical signals are usually slowed by carpal tunnel syndrome. You may also need X-rays. TREATMENT  Carpal tunnel syndrome may clear up by itself. Your health care provider may recommend a wrist splint or medicine such as a nonsteroidal anti-inflammatory medicine. Cortisone injections may help. Sometimes, surgery may be needed to free the pinched nerve.  HOME CARE INSTRUCTIONS   Take all medicine as directed by your health care provider. Only take over-the-counter or prescription medicines for pain, discomfort, or fever as directed by your health care provider.  If you were given a splint to keep your wrist from bending, wear it as directed. It is important to wear the splint at  night. Wear the splint for as long as you have pain or numbness in your hand, arm, or wrist. This may take 1 to 2 months.  Rest your wrist from any activity that may be causing your pain. If your symptoms are work-related, you may need to talk to your employer about changing to a job that does not require using your wrist.  Put ice on your wrist after long periods of wrist activity.  Put ice in a plastic bag.  Place a towel between your skin and the bag.  Leave the ice on for 15-20 minutes, 03-04 times a day.  Keep all follow-up visits as directed by your health care provider. This includes any orthopedic referrals, physical therapy, and rehabilitation. Any delay in getting necessary care could result in a delay or failure of your condition to heal. SEEK IMMEDIATE MEDICAL CARE IF:   You have new, unexplained symptoms.  Your symptoms get worse and are not helped or controlled with medicines. MAKE SURE YOU:   Understand these instructions.  Will watch your condition.  Will get help right away if you are not doing well or get worse. Document Released: 06/22/2000 Document Revised: 11/09/2013 Document Reviewed: 05/11/2011 ExitCare Patient Information 2015 [employer]. This information is not intended to replace advice given to you by your health care provider. Make sure you discuss any questions you have with your health care provider.  

## 2014-06-29 NOTE — Progress Notes (Signed)
   Subjective:    Patient ID: Maria Curry, female    DOB: 1971/06/18, 43 y.o.   MRN: [id number]  HPI Patient in c/o bil hand pain- says they feel numb at times and it is difficult to pick up things.    Review of Systems  Constitutional: Negative.   HENT: Negative.   Respiratory: Negative.   Cardiovascular: Negative.   Genitourinary: Negative.   Neurological: Negative.   Psychiatric/Behavioral: Negative.   All other systems reviewed and are negative.      Objective:   Physical Exam  Constitutional: She is oriented to person, place, and time. She appears well-developed and well-nourished.  Cardiovascular: Normal rate, regular rhythm and normal heart sounds.   Pulmonary/Chest: Effort normal and breath sounds normal.  Musculoskeletal:  Grips equal but weak bil (+) phalen test bil (+) tinel bil  Neurological: She is alert and oriented to person, place, and time.  Skin: Skin is warm and dry.  Psychiatric: She has a normal mood and affect. Her behavior is normal. Judgment and thought content normal.   BP 118/81 mmHg  Pulse 98  Temp(Src) 98.2 F (36.8 C) (Oral)  Ht 5\' 2"  (1.575 m)  Wt 164 lb (74.39 kg)  BMI 29.99 kg/m2        Assessment & Plan:   1. Bilateral carpal tunnel syndrome    Meds ordered this encounter  Medications  . diclofenac (VOLTAREN) 75 MG EC tablet    Sig: Take 1 tablet (75 mg total) by mouth 2 (two) times daily.    Dispense:  60 tablet    Refill:  0    Order Specific Question:  Supervising Provider    Answer:  Ernestina PennaMOORE, DONALD W [[id number]]   Orders Placed This Encounter  Procedures  . Motor nerve conduction test w/ f-wave study    Standing Status: Future     Number of Occurrences:      Standing Expiration Date: 06/30/2015   Wrap wrists at night- can buy splints OTC Moist heat if helps RTO prn  Mary-Margaret Daphine DeutscherMartin, FNP

## 2014-07-06 ENCOUNTER — Telehealth: Payer: Self-pay | Admitting: Nurse Practitioner

## 2014-07-06 ENCOUNTER — Emergency Department (HOSPITAL_COMMUNITY)
Admission: EM | Admit: 2014-07-06 | Discharge: 2014-07-06 | Discharge status: Against Medical Advice | Payer: Medicaid Other | Attending: Emergency Medicine | Admitting: Emergency Medicine

## 2014-07-06 ENCOUNTER — Encounter (HOSPITAL_COMMUNITY): Payer: Self-pay | Admitting: *Deleted

## 2014-07-06 ENCOUNTER — Other Ambulatory Visit: Payer: Self-pay

## 2014-07-06 DIAGNOSIS — M79642 Pain in left hand: Secondary | ICD-10-CM | POA: Diagnosis not present

## 2014-07-06 DIAGNOSIS — G8929 Other chronic pain: Secondary | ICD-10-CM | POA: Insufficient documentation

## 2014-07-06 DIAGNOSIS — G5601 Carpal tunnel syndrome, right upper limb: Secondary | ICD-10-CM | POA: Diagnosis not present

## 2014-07-06 DIAGNOSIS — M79641 Pain in right hand: Secondary | ICD-10-CM | POA: Diagnosis present

## 2014-07-06 DIAGNOSIS — M545 Low back pain: Secondary | ICD-10-CM | POA: Insufficient documentation

## 2014-07-06 DIAGNOSIS — Z720 Tobacco use: Secondary | ICD-10-CM | POA: Insufficient documentation

## 2014-07-06 DIAGNOSIS — G5603 Carpal tunnel syndrome, bilateral upper limbs: Secondary | ICD-10-CM

## 2014-07-06 NOTE — ED Notes (Signed)
Pt said she was leaving. 

## 2014-07-06 NOTE — ED Notes (Addendum)
bil hand and forearm pain, says she has carpal tunnel   , low back pain  Also.

## 2014-07-06 NOTE — Telephone Encounter (Signed)
Please call patient to discuss referral.

## 2014-07-06 NOTE — Telephone Encounter (Signed)
Need to wait until we find out what is going on first- have nerve conduction studies been scheduled?

## 2014-07-07 ENCOUNTER — Emergency Department (HOSPITAL_COMMUNITY)
Admission: EM | Admit: 2014-07-07 | Discharge: 2014-07-07 | Disposition: A | Discharge status: Home / Self Care | Payer: Medicaid Other | Attending: Emergency Medicine | Admitting: Emergency Medicine

## 2014-07-07 ENCOUNTER — Encounter (HOSPITAL_COMMUNITY): Payer: Self-pay | Admitting: Emergency Medicine

## 2014-07-07 DIAGNOSIS — G5603 Carpal tunnel syndrome, bilateral upper limbs: Secondary | ICD-10-CM

## 2014-07-07 DIAGNOSIS — G5602 Carpal tunnel syndrome, left upper limb: Secondary | ICD-10-CM | POA: Diagnosis not present

## 2014-07-07 DIAGNOSIS — F419 Anxiety disorder, unspecified: Secondary | ICD-10-CM | POA: Insufficient documentation

## 2014-07-07 DIAGNOSIS — Z791 Long term (current) use of non-steroidal anti-inflammatories (NSAID): Secondary | ICD-10-CM | POA: Diagnosis not present

## 2014-07-07 DIAGNOSIS — Z8719 Personal history of other diseases of the digestive system: Secondary | ICD-10-CM | POA: Diagnosis not present

## 2014-07-07 DIAGNOSIS — G5601 Carpal tunnel syndrome, right upper limb: Secondary | ICD-10-CM | POA: Diagnosis not present

## 2014-07-07 DIAGNOSIS — Z79899 Other long term (current) drug therapy: Secondary | ICD-10-CM | POA: Diagnosis not present

## 2014-07-07 DIAGNOSIS — Z720 Tobacco use: Secondary | ICD-10-CM | POA: Insufficient documentation

## 2014-07-07 DIAGNOSIS — G8929 Other chronic pain: Secondary | ICD-10-CM | POA: Diagnosis not present

## 2014-07-07 DIAGNOSIS — M79642 Pain in left hand: Secondary | ICD-10-CM | POA: Diagnosis present

## 2014-07-07 MED ORDER — KETOROLAC TROMETHAMINE 10 MG PO TABS
10.0000 mg | ORAL_TABLET | Freq: Once | ORAL | Status: AC
  Administered 2014-07-07: 10 mg via ORAL
  Filled 2014-07-07: qty 1

## 2014-07-07 MED ORDER — PREDNISONE 50 MG PO TABS
60.0000 mg | ORAL_TABLET | Freq: Once | ORAL | Status: AC
  Administered 2014-07-07: 60 mg via ORAL
  Filled 2014-07-07 (×2): qty 1

## 2014-07-07 MED ORDER — DEXAMETHASONE 6 MG PO TABS: ORAL_TABLET | ORAL | Status: DC

## 2014-07-07 MED ORDER — ONDANSETRON HCL 4 MG PO TABS
4.0000 mg | ORAL_TABLET | Freq: Once | ORAL | Status: AC
  Administered 2014-07-07: 4 mg via ORAL
  Filled 2014-07-07: qty 1

## 2014-07-07 NOTE — ED Notes (Signed)
Patient states she was recently diagnosed with carpal tunnel syndrome in bilateral hands. Complaining of worsening pain in bilateral hands x 2 days.

## 2014-07-07 NOTE — Discharge Instructions (Signed)
Please see Maria Curry or a member of her team for assistance with your discomfort while you're waiting to see the orthopedic specialist. Please use the wrist splints until you're seen by orthopedics. Please add the Decadron to your current medication. Carpal Tunnel Syndrome The carpal tunnel is a narrow area located on the palm side of your wrist. The tunnel is formed by the wrist bones and ligaments. Nerves, blood vessels, and tendons pass through the carpal tunnel. Repeated wrist motion or certain diseases may cause swelling within the tunnel. This swelling pinches the main nerve in the wrist (median nerve) and causes the painful hand and arm condition called carpal tunnel syndrome. CAUSES   Repeated wrist motions.  Wrist injuries.  Certain diseases like arthritis, diabetes, alcoholism, hyperthyroidism, and kidney failure.  Obesity.  Pregnancy. SYMPTOMS   A "pins and needles" feeling in your fingers or hand, especially in your thumb, index and middle fingers.  Tingling or numbness in your fingers or hand.  An aching feeling in your entire arm, especially when your wrist and elbow are bent for long periods of time.  Wrist pain that goes up your arm to your shoulder.  Pain that goes down into your palm or fingers.  A weak feeling in your hands. DIAGNOSIS  Your health care provider will take your history and perform a physical exam. An electromyography test may be needed. This test measures electrical signals sent out by your nerves into the muscles. The electrical signals are usually slowed by carpal tunnel syndrome. You may also need X-rays. TREATMENT  Carpal tunnel syndrome may clear up by itself. Your health care provider may recommend a wrist splint or medicine such as a nonsteroidal anti-inflammatory medicine. Cortisone injections may help. Sometimes, surgery may be needed to free the pinched nerve.  HOME CARE INSTRUCTIONS   Take all medicine as directed by your health care  provider. Only take over-the-counter or prescription medicines for pain, discomfort, or fever as directed by your health care provider.  If you were given a splint to keep your wrist from bending, wear it as directed. It is important to wear the splint at night. Wear the splint for as long as you have pain or numbness in your hand, arm, or wrist. This may take 1 to 2 months.  Rest your wrist from any activity that may be causing your pain. If your symptoms are work-related, you may need to talk to your employer about changing to a job that does not require using your wrist.  Put ice on your wrist after long periods of wrist activity.  Put ice in a plastic bag.  Place a towel between your skin and the bag.  Leave the ice on for 15-20 minutes, 03-04 times a day.  Keep all follow-up visits as directed by your health care provider. This includes any orthopedic referrals, physical therapy, and rehabilitation. Any delay in getting necessary care could result in a delay or failure of your condition to heal. SEEK IMMEDIATE MEDICAL CARE IF:   You have new, unexplained symptoms.  Your symptoms get worse and are not helped or controlled with medicines. MAKE SURE YOU:   Understand these instructions.  Will watch your condition.  Will get help right away if you are not doing well or get worse. Document Released: 06/22/2000 Document Revised: 11/09/2013 Document Reviewed: 05/11/2011 [facility] Patient Information 2015 [place], [place]. This information is not intended to replace advice given to you by your health care provider. Make sure you discuss any  questions you have with your health care provider. ° °

## 2014-07-07 NOTE — ED Provider Notes (Signed)
CSN: [id number]     Arrival date & time 07/07/14  1601 History   First MD Initiated Contact with Patient 07/07/14 1618     Chief Complaint  Patient presents with  . Hand Pain     (Consider location/radiation/quality/duration/timing/severity/associated sxs/prior Treatment) Patient is a 43 y.o. female presenting with hand pain. The history is provided by the patient.  Hand Pain This is a chronic problem. The problem occurs intermittently. The problem has been gradually worsening. Associated symptoms include arthralgias and myalgias. Pertinent negatives include no abdominal pain, chest pain, coughing, neck pain, rash or weakness. Nothing aggravates the symptoms. She has tried nothing for the symptoms. The treatment provided moderate relief.    Past Medical History  Diagnosis Date  . Chronic back pain   . Anxiety   . Panic attacks   . GERD (gastroesophageal reflux disease)    Past Surgical History  Procedure Laterality Date  . Orif facial fracture      pt has plates to left side of face  . Tubal ligation     Family History  Problem Relation Age of Onset  . Heart disease Mother   . Anuerysm Father   . Epilepsy Sister   . Cirrhosis Brother   . Heart disease Brother   . Epilepsy Brother    History  Substance Use Topics  . Smoking status: Current Some Day Smoker    Types: Cigarettes  . Smokeless tobacco: Not on file  . Alcohol Use: Yes     Comment: occasionally   OB History    No data available     Review of Systems  Constitutional: Negative for activity change.       All ROS Neg except as noted in HPI  Eyes: Negative for photophobia and discharge.  Respiratory: Negative for cough, shortness of breath and wheezing.   Cardiovascular: Negative for chest pain and palpitations.  Gastrointestinal: Negative for abdominal pain and blood in stool.  Genitourinary: Negative for dysuria, frequency and hematuria.  Musculoskeletal: Positive for myalgias, back pain and  arthralgias. Negative for neck pain.  Skin: Negative.  Negative for rash.  Neurological: Negative for dizziness, seizures, speech difficulty and weakness.  Psychiatric/Behavioral: Negative for hallucinations and confusion. The patient is nervous/anxious.       Allergies  Bee venom and Ibuprofen  Home Medications   Prior to Admission medications   Medication Sig Start Date End Date Taking? Authorizing Provider  acetaminophen (TYLENOL) 500 MG tablet Take 1,000 mg by mouth every 4 (four) hours as needed for mild pain or moderate pain.     Historical Provider, MD  diclofenac (VOLTAREN) 75 MG EC tablet Take 1 tablet (75 mg total) by mouth 2 (two) times daily. 06/29/14   Mary-Margaret Daphine DeutscherMartin, FNP  sertraline (ZOLOFT) 50 MG tablet Take 1 tablet (50 mg total) by mouth every morning. 04/20/14   Mary-Margaret Daphine DeutscherMartin, FNP   BP 117/72 mmHg  Pulse 85  Temp(Src) 98 F (36.7 C) (Oral)  Resp 18  Ht 5\' 2"  (1.575 m)  Wt 165 lb (74.844 kg)  BMI 30.17 kg/m2  SpO2 100%  LMP 06/29/2014 Physical Exam  Constitutional: She is oriented to person, place, and time. She appears well-developed and well-nourished.  Non-toxic appearance.  HENT:  Head: Normocephalic.  Right Ear: Tympanic membrane and external ear normal.  Left Ear: Tympanic membrane and external ear normal.  Eyes: EOM and lids are normal. Pupils are equal, round, and reactive to light.  Neck: Normal range of motion. Neck supple. Carotid  bruit is not present.  Cardiovascular: Normal rate, regular rhythm, normal heart sounds, intact distal pulses and normal pulses.   Pulmonary/Chest: Breath sounds normal. No respiratory distress.  Abdominal: Soft. Bowel sounds are normal. There is no tenderness. There is no guarding.  Musculoskeletal: Normal range of motion.       Right wrist: She exhibits tenderness.  There is full range of motion of the shoulders, elbows, wrists, and fingers. There are some degenerative changes of the hands bilaterally.  There are bilateral Tinel's sign present. There is no atrophy of the thenar eminence appreciated.  Lymphadenopathy:       Head (right side): No submandibular adenopathy present.       Head (left side): No submandibular adenopathy present.    She has no cervical adenopathy.  Neurological: She is alert and oriented to person, place, and time. She has normal strength. No cranial nerve deficit or sensory deficit.  There is no sensory or motor deficits appreciated of the upper extremities. Grip symmetrical  Skin: Skin is warm and dry.  Psychiatric: She has a normal mood and affect. Her speech is normal.  Nursing note and vitals reviewed.   ED Course  Procedures (including critical care time) Labs Review Labs Reviewed - No data to display  Imaging Review No results found.   EKG Interpretation None      MDM  Patient has been diagnosed with carpal tunnel syndrome. She is in the middle of a referral to orthopedics for orthopedic evaluation of her wrist pain. No high fevers reported. The patient is not dropping objects. She has not lost sensation of her upper extremities.  Suspect an exacerbation of the carpal tunnel syndrome. The patient is fitted with bilateral wrist splints, we'll add Decadron to the current diclofenac prescription.    Final diagnoses:  None    **I have reviewed nursing notes, vital signs, and all appropriate lab and imaging results for this patient.Kathie Dike*    Laquia Rosano M Khyler Eschmann, PA-C 07/07/14 1636  Donnetta HutchingBrian Cook, MD 07/08/14 [phone]

## 2014-07-07 NOTE — ED Notes (Addendum)
In ER yesterday for same, but left due to long wait.  Pt says she has been dx with bil carpal tunnel and has not been seen by ortho yet.  Pt worked today.

## 2014-11-29 ENCOUNTER — Emergency Department (HOSPITAL_COMMUNITY)
Admission: EM | Admit: 2014-11-29 | Discharge: 2014-11-29 | Disposition: A | Discharge status: Home / Self Care | Payer: Medicaid Other | Attending: Emergency Medicine | Admitting: Emergency Medicine

## 2014-11-29 ENCOUNTER — Encounter (HOSPITAL_COMMUNITY): Payer: Self-pay | Admitting: *Deleted

## 2014-11-29 DIAGNOSIS — Y998 Other external cause status: Secondary | ICD-10-CM | POA: Diagnosis not present

## 2014-11-29 DIAGNOSIS — Y9389 Activity, other specified: Secondary | ICD-10-CM | POA: Diagnosis not present

## 2014-11-29 DIAGNOSIS — Y9289 Other specified places as the place of occurrence of the external cause: Secondary | ICD-10-CM | POA: Insufficient documentation

## 2014-11-29 DIAGNOSIS — S199XXA Unspecified injury of neck, initial encounter: Secondary | ICD-10-CM | POA: Diagnosis present

## 2014-11-29 DIAGNOSIS — Z8719 Personal history of other diseases of the digestive system: Secondary | ICD-10-CM | POA: Diagnosis not present

## 2014-11-29 DIAGNOSIS — G8929 Other chronic pain: Secondary | ICD-10-CM | POA: Diagnosis not present

## 2014-11-29 DIAGNOSIS — S39012A Strain of muscle, fascia and tendon of lower back, initial encounter: Secondary | ICD-10-CM | POA: Insufficient documentation

## 2014-11-29 DIAGNOSIS — Z720 Tobacco use: Secondary | ICD-10-CM | POA: Diagnosis not present

## 2014-11-29 DIAGNOSIS — X58XXXA Exposure to other specified factors, initial encounter: Secondary | ICD-10-CM | POA: Insufficient documentation

## 2014-11-29 DIAGNOSIS — F410 Panic disorder [episodic paroxysmal anxiety] without agoraphobia: Secondary | ICD-10-CM | POA: Insufficient documentation

## 2014-11-29 DIAGNOSIS — Z79899 Other long term (current) drug therapy: Secondary | ICD-10-CM | POA: Diagnosis not present

## 2014-11-29 DIAGNOSIS — Z791 Long term (current) use of non-steroidal anti-inflammatories (NSAID): Secondary | ICD-10-CM | POA: Insufficient documentation

## 2014-11-29 MED ORDER — MELOXICAM 15 MG PO TABS: 15.0000 mg | ORAL_TABLET | Freq: Every day | ORAL | Status: DC

## 2014-11-29 MED ORDER — TRAMADOL HCL 50 MG PO TABS
50.0000 mg | ORAL_TABLET | Freq: Once | ORAL | Status: AC
Start: 2014-11-29 — End: 2014-11-29
  Administered 2014-11-29: 50 mg via ORAL
  Filled 2014-11-29: qty 1

## 2014-11-29 MED ORDER — TRAMADOL HCL 50 MG PO TABS: 50.0000 mg | ORAL_TABLET | Freq: Four times a day (QID) | ORAL | Status: DC | PRN

## 2014-11-29 MED ORDER — IBUPROFEN 800 MG PO TABS
800.0000 mg | ORAL_TABLET | Freq: Once | ORAL | Status: AC
  Administered 2014-11-29: 800 mg via ORAL
  Filled 2014-11-29: qty 1

## 2014-11-29 MED ORDER — ONDANSETRON 4 MG PO TBDP
4.0000 mg | ORAL_TABLET | Freq: Once | ORAL | Status: AC
  Administered 2014-11-29: 4 mg via ORAL
  Filled 2014-11-29: qty 1

## 2014-11-29 MED ORDER — ONDANSETRON 4 MG PO TBDP: 4.0000 mg | ORAL_TABLET | Freq: Three times a day (TID) | ORAL | Status: DC | PRN

## 2014-11-29 NOTE — ED Provider Notes (Signed)
This chart was scribed for Layla MawKristen N Anabia Weatherwax, DO by Gwenyth Oberatherine Macek, ED Scribe. This patient was seen in room APA04/[facility]04 and the patient's care was started at 11:05 PM.   TIME SEEN: 11:06 PM  CHIEF COMPLAINT:  Chief Complaint  Patient presents with  . Neck Pain   HPI: HPI Comments: Maria Curry is a 44 y.o. female with a history of chronic back pain who presents to the Emergency Department complaining of constant, moderate, generalized,  pain that starts at her neck and radiates to her bilateral knees starting 3 days ago. She states pain becomes better with rest and heat, but worse with movement. Pt reports that onset of pain occurred after she moved a heavy couch. She denies recent falls. Pt also denies numbness, focal weakness, bladder or bowel incontinence and fever as associated symptoms. States she feels a tingling sensation in her right trapezius with palpation.   ROS: See HPI Constitutional: no fever  Eyes: no drainage  ENT: no runny nose   Cardiovascular:  no chest pain  Resp: no SOB  GI: no vomiting GU: no dysuria Integumentary: no rash  Allergy: no hives  Musculoskeletal: no leg swelling  Neurological: no slurred speech ROS otherwise negative  PAST MEDICAL HISTORY/PAST SURGICAL HISTORY:  Past Medical History  Diagnosis Date  . Chronic back pain   . Anxiety   . Panic attacks   . GERD (gastroesophageal reflux disease)     MEDICATIONS:  Prior to Admission medications   Medication Sig Start Date End Date Taking? Authorizing Provider  acetaminophen (TYLENOL) 500 MG tablet Take 1,000 mg by mouth every 4 (four) hours as needed for mild pain or moderate pain.     Historical Provider, MD  dexamethasone (DECADRON) 6 MG tablet 1 po bid with food 07/07/14   Ivery QualeHobson Bryant, PA-C  diclofenac (VOLTAREN) 75 MG EC tablet Take 1 tablet (75 mg total) by mouth 2 (two) times daily. 06/29/14   Mary-Margaret Daphine DeutscherMartin, FNP  sertraline (ZOLOFT) 50 MG tablet Take 1 tablet (50 mg total) by  mouth every morning. 04/20/14   Mary-Margaret Daphine DeutscherMartin, FNP    ALLERGIES:  Allergies  Allergen Reactions  . Bee Venom Swelling  . Ibuprofen Nausea Only    SOCIAL HISTORY:  History  Substance Use Topics  . Smoking status: Current Some Day Smoker    Types: Cigarettes  . Smokeless tobacco: Not on file  . Alcohol Use: Yes     Comment: occasionally    FAMILY HISTORY: Family History  Problem Relation Age of Onset  . Heart disease Mother   . Anuerysm Father   . Epilepsy Sister   . Cirrhosis Brother   . Heart disease Brother   . Epilepsy Brother     EXAM: BP 120/74 mmHg  Pulse 113  Temp(Src) 98.8 F (37.1 C) (Oral)  Resp 20  Ht 5\' 2"  (1.575 m)  Wt 160 lb (72.576 kg)  BMI 29.26 kg/m2  SpO2 100%  LMP 10/31/2014 CONSTITUTIONAL: Alert and oriented and responds appropriately to questions. Well-appearing; well-nourished, nontoxic, no distress HEAD: Normocephalic EYES: Conjunctivae clear, PERRL ENT: normal nose; no rhinorrhea; moist mucous membranes; pharynx without lesions noted NECK: Supple, no meningismus, no LAD  CARD: Regular and minimally tachycardic; S1 and S2 appreciated; no murmurs, no clicks, no rubs, no gallops RESP: Normal chest excursion without splinting or tachypnea; breath sounds clear and equal bilaterally; no wheezes, no rhonchi, no rales, no hypoxia or respiratory distress, speaking full sentences ABD/GI: Normal bowel sounds; non-distended; soft, non-tender, no  rebound, no guarding, no peritoneal signs BACK:  The back appears normal, there is no CVA tenderness; diffusely tender over bilateral paraspinal muscles in the thoracic and lumbar region; no midline spinal tenderness EXT: Normal ROM in all joints; non-tender to palpation; no edema; normal capillary refill; no cyanosis, no calf tenderness or swelling    SKIN: Normal color for age and race; warm NEURO: Moves all extremities equally, sensation to light touch intact diffusely, cranial nerves II through XII  intact; normal gait PSYCH: The patient's mood and manner are appropriate. Grooming and personal hygiene are appropriate.  MEDICAL DECISION MAKING: Patient here with neck, back and hamstring pain after lifting a heavy couch several days ago. No injury to suggest fracture. I do not feel x-rays are indicated or any other emergent imaging. She is neurologically intact, hemodynamically stable. No red flag symptoms. Doubt cauda equina, discitis, transverse myelitis, spinal stenosis, epidural abscess or hematoma. We'll discharge with midback, tramadol. Discussed rest, heat, importance of anti-inflammatories. Discussed stretching. Discussed return precautions. She verbalized understanding and is comfortable with plan.    I personally performed the services described in this documentation, which was scribed in my presence. The recorded information has been reviewed and is accurate.   Layla Maw Melvin Ducre, DO 11/29/14 [id number]

## 2014-11-29 NOTE — ED Notes (Signed)
Pt c/o pain from her neck to her knees since Friday. Pt very vague when explaining what happened. Pt finally stated she was re-arranging her furniture.

## 2014-11-29 NOTE — Discharge Instructions (Signed)
Muscle Strain A muscle strain is an injury that occurs when a muscle is stretched beyond its normal length. Usually a small number of muscle fibers are torn when this happens. Muscle strain is rated in degrees. First-degree strains have the least amount of muscle fiber tearing and pain. Second-degree and third-degree strains have increasingly more tearing and pain.  Usually, recovery from muscle strain takes 1-2 weeks. Complete healing takes 5-6 weeks.  CAUSES  Muscle strain happens when a sudden, violent force placed on a muscle stretches it too far. This may occur with lifting, sports, or a fall.  RISK FACTORS Muscle strain is especially common in athletes.  SIGNS AND SYMPTOMS At the site of the muscle strain, there may be:  Pain.  Bruising.  Swelling.  Difficulty using the muscle due to pain or lack of normal function. DIAGNOSIS  Your health care provider will perform a physical exam and ask about your medical history. TREATMENT  Often, the best treatment for a muscle strain is resting, icing, and applying cold compresses to the injured area.  HOME CARE INSTRUCTIONS   Use the PRICE method of treatment to promote muscle healing during the first 2-3 days after your injury. The PRICE method involves:  Protecting the muscle from being injured again.  Restricting your activity and resting the injured body part.  Icing your injury. To do this, put ice in a plastic bag. Place a towel between your skin and the bag. Then, apply the ice and leave it on from 15-20 minutes each hour. After the third day, switch to moist heat packs.  Apply compression to the injured area with a splint or elastic bandage. Be careful not to wrap it too tightly. This may interfere with blood circulation or increase swelling.  Elevate the injured body part above the level of your heart as often as you can.  Only take over-the-counter or prescription medicines for pain, discomfort, or fever as directed by your  health care provider.  Warming up prior to exercise helps to prevent future muscle strains. SEEK MEDICAL CARE IF:   You have increasing pain or swelling in the injured area.  You have numbness, tingling, or a significant loss of strength in the injured area. MAKE SURE YOU:   Understand these instructions.  Will watch your condition.  Will get help right away if you are not doing well or get worse. Document Released: 06/25/2005 Document Revised: 04/15/2013 Document Reviewed: 01/22/2013 [facility] Patient Information 2015 [place], [place]. This information is not intended to replace advice given to you by your health care provider. Make sure you discuss any questions you have with your health care provider.  Back Pain, Adult Low back pain is very common. About 1 in 5 people have back pain.The cause of low back pain is rarely dangerous. The pain often gets better over time.About half of people with a sudden onset of back pain feel better in just 2 weeks. About 8 in 10 people feel better by 6 weeks.  CAUSES Some common causes of back pain include:  Strain of the muscles or ligaments supporting the spine.  Wear and tear (degeneration) of the spinal discs.  Arthritis.  Direct injury to the back. DIAGNOSIS Most of the time, the direct cause of low back pain is not known.However, back pain can be treated effectively even when the exact cause of the pain is unknown.Answering your caregiver's questions about your overall health and symptoms is one of the most accurate ways to make sure the  cause of your pain is not dangerous. If your caregiver needs more information, he or she may order lab work or imaging tests (X-rays or MRIs).However, even if imaging tests show changes in your back, this usually does not require surgery. HOME CARE INSTRUCTIONS For many people, back pain returns.Since low back pain is rarely dangerous, it is often a condition that people can learn to [facility] their own.     Remain active. It is stressful on the back to sit or stand in one place. Do not sit, drive, or stand in one place for more than 30 minutes at a time. Take short walks on level surfaces as soon as pain allows.Try to increase the length of time you walk each day.  Do not stay in bed.Resting more than 1 or 2 days can delay your recovery.  Do not avoid exercise or work.Your body is made to move.It is not dangerous to be active, even though your back may hurt.Your back will likely heal faster if you return to being active before your pain is gone.  Pay attention to your body when you bend and lift. Many people have less discomfortwhen lifting if they bend their knees, keep the load close to their bodies,and avoid twisting. Often, the most comfortable positions are those that put less stress on your recovering back.  Find a comfortable position to sleep. Use a firm mattress and lie on your side with your knees slightly bent. If you lie on your back, put a pillow under your knees.  Only take over-the-counter or prescription medicines as directed by your caregiver. Over-the-counter medicines to reduce pain and inflammation are often the most helpful.Your caregiver may prescribe muscle relaxant drugs.These medicines help dull your pain so you can more quickly return to your normal activities and healthy exercise.  Put ice on the injured area.  Put ice in a plastic bag.  Place a towel between your skin and the bag.  Leave the ice on for 15-20 minutes, 03-04 times a day for the first 2 to 3 days. After that, ice and heat may be alternated to reduce pain and spasms.  Ask your caregiver about trying back exercises and gentle massage. This may be of some benefit.  Avoid feeling anxious or stressed.Stress increases muscle tension and can worsen back pain.It is important to recognize when you are anxious or stressed and learn ways to manage it.Exercise is a great option. SEEK MEDICAL CARE  IF:  You have pain that is not relieved with rest or medicine.  You have pain that does not improve in 1 week.  You have new symptoms.  You are generally not feeling well. SEEK IMMEDIATE MEDICAL CARE IF:   You have pain that radiates from your back into your legs.  You develop new bowel or bladder control problems.  You have unusual weakness or numbness in your arms or legs.  You develop nausea or vomiting.  You develop abdominal pain.  You feel faint. Document Released: 06/25/2005 Document Revised: 12/25/2011 Document Reviewed: 10/27/2013 [facility] Patient Information 2015 [place], [place]. This information is not intended to replace advice given to you by your health care provider. Make sure you discuss any questions you have with your health care provider.   Heat Therapy Heat therapy can help ease sore, stiff, injured, and tight muscles and joints. Heat relaxes your muscles, which may help ease your pain.  RISKS AND COMPLICATIONS If you have any of the following conditions, do not use heat therapy unless your  health care provider has approved:  Poor circulation.  Healing wounds or scarred skin in the area being treated.  Diabetes, heart disease, or high blood pressure.  Not being able to feel (numbness) the area being treated.  Unusual swelling of the area being treated.  Active infections.  Blood clots.  Cancer.  Inability to communicate pain. This may include young children and people who have problems with their brain function (dementia).  Pregnancy. Heat therapy should only be used on old, pre-existing, or long-lasting (chronic) injuries. Do not use heat therapy on new injuries unless directed by your health care provider. HOW TO USE HEAT THERAPY There are several different kinds of heat therapy, including:  Moist heat pack.  Warm water bath.  Hot water bottle.  Electric heating pad.  Heated gel pack.  Heated wrap.  Electric heating pad. Use the  heat therapy method suggested by your health care provider. Follow your health care provider's instructions on when and how to use heat therapy. GENERAL HEAT THERAPY RECOMMENDATIONS  Do not sleep while using heat therapy. Only use heat therapy while you are awake.  Your skin may turn pink while using heat therapy. Do not use heat therapy if your skin turns red.  Do not use heat therapy if you have new pain.  High heat or long exposure to heat can cause burns. Be careful when using heat therapy to avoid burning your skin.  Do not use heat therapy on areas of your skin that are already irritated, such as with a rash or sunburn. SEEK MEDICAL CARE IF:  You have blisters, redness, swelling, or numbness.  You have new pain.  Your pain is worse. MAKE SURE YOU:  Understand these instructions.  Will watch your condition.  Will get help right away if you are not doing well or get worse. Document Released: 09/17/2011 Document Revised: 11/09/2013 Document Reviewed: 08/18/2013 [facility] Patient Information 2015 [place], [place]. This information is not intended to replace advice given to you by your health care provider. Make sure you discuss any questions you have with your health care provider.

## 2015-01-14 ENCOUNTER — Other Ambulatory Visit: Payer: Self-pay | Admitting: Nurse Practitioner

## 2015-01-14 NOTE — Telephone Encounter (Signed)
Last seen 06/29/14 MMM No upcoming appt

## 2015-01-25 ENCOUNTER — Encounter (HOSPITAL_COMMUNITY): Payer: Self-pay | Admitting: Emergency Medicine

## 2015-01-25 ENCOUNTER — Emergency Department (HOSPITAL_COMMUNITY)
Admission: EM | Admit: 2015-01-25 | Discharge: 2015-01-25 | Disposition: A | Discharge status: Home / Self Care | Payer: Medicaid Other | Attending: Emergency Medicine | Admitting: Emergency Medicine

## 2015-01-25 DIAGNOSIS — Z720 Tobacco use: Secondary | ICD-10-CM | POA: Insufficient documentation

## 2015-01-25 DIAGNOSIS — F419 Anxiety disorder, unspecified: Secondary | ICD-10-CM | POA: Insufficient documentation

## 2015-01-25 DIAGNOSIS — M79604 Pain in right leg: Secondary | ICD-10-CM | POA: Diagnosis present

## 2015-01-25 DIAGNOSIS — Z8719 Personal history of other diseases of the digestive system: Secondary | ICD-10-CM | POA: Diagnosis not present

## 2015-01-25 DIAGNOSIS — Z79899 Other long term (current) drug therapy: Secondary | ICD-10-CM | POA: Insufficient documentation

## 2015-01-25 DIAGNOSIS — G8929 Other chronic pain: Secondary | ICD-10-CM | POA: Insufficient documentation

## 2015-01-25 DIAGNOSIS — F329 Major depressive disorder, single episode, unspecified: Secondary | ICD-10-CM | POA: Diagnosis not present

## 2015-01-25 DIAGNOSIS — M5416 Radiculopathy, lumbar region: Secondary | ICD-10-CM | POA: Insufficient documentation

## 2015-01-25 MED ORDER — PREDNISONE 10 MG PO TABS: ORAL_TABLET | ORAL | Status: DC

## 2015-01-25 MED ORDER — HYDROCODONE-ACETAMINOPHEN 5-325 MG PO TABS
1.0000 | ORAL_TABLET | Freq: Once | ORAL | Status: AC
Start: 2015-01-25 — End: 2015-01-25
  Administered 2015-01-25: 1 via ORAL
  Filled 2015-01-25: qty 1

## 2015-01-25 MED ORDER — HYDROCODONE-ACETAMINOPHEN 5-325 MG PO TABS: 1.0000 | ORAL_TABLET | ORAL | Status: DC | PRN

## 2015-01-25 NOTE — ED Notes (Signed)
Patient reports bilateral leg pain. "I'm hurting from my hips down to my ankles." Patient also reports lower back pain with history of chronic back pain.

## 2015-01-25 NOTE — ED Notes (Signed)
Patient with no complaints at this time. Respirations even and unlabored. Skin warm/dry. Discharge instructions reviewed with patient at this time. Patient given opportunity to voice concerns/ask questions. Patient discharged at this time and left Emergency Department with steady gait.   

## 2015-01-25 NOTE — Discharge Instructions (Signed)
Lumbosacral Strain Lumbosacral strain is a strain of any of the parts that make up your lumbosacral vertebrae. Your lumbosacral vertebrae are the bones that make up the lower third of your backbone. Your lumbosacral vertebrae are held together by muscles and tough, fibrous tissue (ligaments).  CAUSES  A sudden blow to your back can cause lumbosacral strain. Also, anything that causes an excessive stretch of the muscles in the low back can cause this strain. This is typically seen when people exert themselves strenuously, fall, lift heavy objects, bend, or crouch repeatedly. RISK FACTORS  Physically demanding work.  Participation in pushing or pulling sports or sports that require a sudden twist of the back (tennis, golf, baseball).  Weight lifting.  Excessive lower back curvature.  Forward-tilted pelvis.  Weak back or abdominal muscles or both.  Tight hamstrings. SIGNS AND SYMPTOMS  Lumbosacral strain may cause pain in the area of your injury or pain that moves (radiates) down your leg.  DIAGNOSIS Your health care provider can often diagnose lumbosacral strain through a physical exam. In some cases, you may need tests such as X-ray exams.  TREATMENT  Treatment for your lower back injury depends on many factors that your clinician will have to evaluate. However, most treatment will include the use of anti-inflammatory medicines. HOME CARE INSTRUCTIONS   Avoid hard physical activities (tennis, racquetball, waterskiing) if you are not in proper physical condition for it. This may aggravate or create problems.  If you have a back problem, avoid sports requiring sudden body movements. Swimming and walking are generally safer activities.  Maintain good posture.  Maintain a healthy weight.  For acute conditions, you may put ice on the injured area.  Put ice in a plastic bag.  Place a towel between your skin and the bag.  Leave the ice on for 20 minutes, 2-3 times a day.  When the  low back starts healing, stretching and strengthening exercises may be recommended. SEEK MEDICAL CARE IF:  Your back pain is getting worse.  You experience severe back pain not relieved with medicines. SEEK IMMEDIATE MEDICAL CARE IF:   You have numbness, tingling, weakness, or problems with the use of your arms or legs.  There is a change in bowel or bladder control.  You have increasing pain in any area of the body, including your belly (abdomen).  You notice shortness of breath, dizziness, or feel faint.  You feel sick to your stomach (nauseous), are throwing up (vomiting), or become sweaty.  You notice discoloration of your toes or legs, or your feet get very cold. MAKE SURE YOU:   Understand these instructions.  Will watch your condition.  Will get help right away if you are not doing well or get worse. Document Released: 04/04/2005 Document Revised: 06/30/2013 Document Reviewed: 02/11/2013 [facility] Patient Information 2015 [place], [place]. This information is not intended to replace advice given to you by your health care provider. Make sure you discuss any questions you have with your health care provider.   Take the medications prescribed.  Do not drive within 4 hours of taking hydrocodone as this medication will make you drowsy.  I have printed information for you regarding fibromyalgia. Please discuss this possibility further with your primary doctor if you think your symptoms are similar to this condition.

## 2015-01-26 NOTE — ED Provider Notes (Signed)
CSN: [id number]     Arrival date & time 01/25/15  2008 History   First MD Initiated Contact with Patient 01/25/15 2026     Chief Complaint  Patient presents with  . Leg Pain     (Consider location/radiation/quality/duration/timing/severity/associated sxs/prior Treatment) The history is provided by the patient.   Maria Curry is a 44 y.o. female with a history of chronic low back pain and known degenerative disk disease per prior mri presenting with low back pain and pain radiating from her hips to her ankles bilateral distribution, present for the past 5-6 days.  She denies new injury.  Her pain is constant and worsened with movement.  Her pain is sharp, constant and cannot find a position which relieves her pain.  She denies weakness or numbness in her legs and has had no urinary or bowel incontinence or retention.  She has taken tylenol without relief of symptoms.  She also endorses worsening of her bilateral carpal tunnel syndrome.    She reports this is a chronic problem. She expresses frustration that "nothing has been done about this" but also states she was referred for further testing "a while back" but missed the appt.      Past Medical History  Diagnosis Date  . Chronic back pain   . Anxiety   . Panic attacks   . GERD (gastroesophageal reflux disease)    Past Surgical History  Procedure Laterality Date  . Orif facial fracture      pt has plates to left side of face  . Tubal ligation     Family History  Problem Relation Age of Onset  . Heart disease Mother   . Anuerysm Father   . Epilepsy Sister   . Cirrhosis Brother   . Heart disease Brother   . Epilepsy Brother    History  Substance Use Topics  . Smoking status: Current Some Day Smoker    Types: Cigarettes  . Smokeless tobacco: Not on file  . Alcohol Use: Yes     Comment: occasionally   OB History    No data available     Review of Systems  Constitutional: Negative for fever.  Respiratory: Negative  for shortness of breath.   Cardiovascular: Negative for chest pain and leg swelling.  Gastrointestinal: Negative for abdominal pain, constipation and abdominal distention.  Genitourinary: Negative for dysuria, urgency, frequency, flank pain and difficulty urinating.  Musculoskeletal: Positive for back pain and arthralgias. Negative for myalgias, joint swelling and gait problem.  Skin: Negative for rash.  Neurological: Negative for weakness and numbness.      Allergies  Bee venom and Ibuprofen  Home Medications   Prior to Admission medications   Medication Sig Start Date End Date Taking? Authorizing Provider  sertraline (ZOLOFT) 50 MG tablet TAKE 1 TABLET BY MOUTH EVERY MORNING 01/14/15  Yes Mary-Margaret Daphine Deutscher, FNP  dexamethasone (DECADRON) 6 MG tablet 1 po bid with food Patient not taking: Reported on 01/25/2015 07/07/14   Ivery Quale, PA-C  diclofenac (VOLTAREN) 75 MG EC tablet Take 1 tablet (75 mg total) by mouth 2 (two) times daily. Patient not taking: Reported on 01/25/2015 06/29/14   Mary-Margaret Daphine Deutscher, FNP  HYDROcodone-acetaminophen (NORCO/VICODIN) 5-325 MG per tablet Take 1 tablet by mouth every 4 (four) hours as needed. 01/25/15   Burgess Amor, PA-C  meloxicam (MOBIC) 15 MG tablet Take 1 tablet (15 mg total) by mouth daily. Patient not taking: Reported on 01/25/2015 11/29/14   Layla Maw Ward, DO  ondansetron [facility]  ODT) 4 MG disintegrating tablet Take 1 tablet (4 mg total) by mouth every 8 (eight) hours as needed for nausea or vomiting. Patient not taking: Reported on 01/25/2015 11/29/14   Layla MawKristen N Ward, DO  predniSONE (DELTASONE) 10 MG tablet 6, 5, 4, 3, 2 then 1 tablet by mouth daily for 6 days total. 01/25/15   Burgess AmorJulie Corneisha Alvi, PA-C  traMADol (ULTRAM) 50 MG tablet Take 1 tablet (50 mg total) by mouth every 6 (six) hours as needed. Patient not taking: Reported on 01/25/2015 11/29/14   Kristen N Ward, DO   BP 104/69 mmHg  Pulse 93  Temp(Src) 98.7 F (37.1 C) (Oral)  Resp 14  Ht 5'  2" (1.575 m)  Wt 160 lb (72.576 kg)  BMI 29.26 kg/m2  SpO2 100%  LMP 01/03/2015 Physical Exam  Constitutional: She appears well-developed and well-nourished.  HENT:  Head: Normocephalic.  Eyes: Conjunctivae are normal.  Neck: Normal range of motion. Neck supple.  Cardiovascular: Normal rate and intact distal pulses.   Pedal pulses normal.  Pulmonary/Chest: Effort normal.  Abdominal: Soft. Bowel sounds are normal. She exhibits no distension and no mass.  Musculoskeletal: Normal range of motion. She exhibits no edema.       Lumbar back: She exhibits tenderness. She exhibits no swelling, no edema and no spasm.  Neurological: She is alert. She has normal strength. She displays no atrophy and no tremor. No sensory deficit. Gait normal.  Reflex Scores:      Patellar reflexes are 2+ on the right side and 2+ on the left side.      Achilles reflexes are 2+ on the right side and 2+ on the left side. No strength deficit noted in hip and knee flexor and extensor muscle groups.  Ankle flexion and extension intact.  Equal grip strength.  Negative tinel's.  Skin: Skin is warm and dry.  Psychiatric: She has a normal mood and affect.  Nursing note and vitals reviewed.   ED Course  Procedures (including critical care time) Labs Review Labs Reviewed - No data to display  Imaging Review No results found.   EKG Interpretation None      MDM   Final diagnoses:  Lumbar radicular pain    Acute on chronic pain.  Pt also endorses has pain in upper back, sometimes upper chest, mid thighs.  ? Fibromyalgia component to pts various pain complaint.  She was encouraged that she needs to f/u with her pcp and she needs to make sure she makes it to any appointments her pcp arranges for her, or she will never have any appropriate tx/ surgery/etc for resolution of her sx.  Advised pt she needs to be proactive regarding her health.  She was prescribed a small quantity of hydrocodone, prednisone taper.  F/u  with pcp.   controlled substance database reviewed.   No neuro deficit on exam or by history to suggest emergent or surgical presentation.  Also discussed worsened sx that should prompt immediate re-evaluation including distal weakness, bowel/bladder retention/incontinence.          Burgess AmorJulie Shawnese Magner, PA-C 01/26/15 1311  Donnetta HutchingBrian Cook, MD 01/27/15 [phone]

## 2015-02-18 ENCOUNTER — Other Ambulatory Visit: Payer: Self-pay | Admitting: Nurse Practitioner

## 2015-02-22 ENCOUNTER — Other Ambulatory Visit: Payer: Self-pay

## 2015-06-13 ENCOUNTER — Ambulatory Visit: Payer: Medicaid Other | Admitting: Pediatrics

## 2015-06-14 ENCOUNTER — Encounter: Payer: Self-pay | Admitting: Nurse Practitioner

## 2016-02-28 ENCOUNTER — Encounter (HOSPITAL_COMMUNITY): Payer: Self-pay

## 2016-02-28 ENCOUNTER — Emergency Department (HOSPITAL_COMMUNITY)
Admission: EM | Admit: 2016-02-28 | Discharge: 2016-02-28 | Disposition: A | Discharge status: Home / Self Care | Payer: Medicaid Other | Attending: Emergency Medicine | Admitting: Emergency Medicine

## 2016-02-28 DIAGNOSIS — F17210 Nicotine dependence, cigarettes, uncomplicated: Secondary | ICD-10-CM | POA: Insufficient documentation

## 2016-02-28 DIAGNOSIS — M545 Low back pain, unspecified: Secondary | ICD-10-CM

## 2016-02-28 DIAGNOSIS — Z79899 Other long term (current) drug therapy: Secondary | ICD-10-CM | POA: Insufficient documentation

## 2016-02-28 DIAGNOSIS — M542 Cervicalgia: Secondary | ICD-10-CM | POA: Diagnosis not present

## 2016-02-28 MED ORDER — CYCLOBENZAPRINE HCL 5 MG PO TABS: 5.0000 mg | ORAL_TABLET | Freq: Three times a day (TID) | ORAL | 0 refills | Status: DC | PRN

## 2016-02-28 MED ORDER — MELOXICAM 7.5 MG PO TABS: 7.5000 mg | ORAL_TABLET | Freq: Every day | ORAL | 0 refills | Status: DC

## 2016-02-28 NOTE — Discharge Instructions (Signed)
Take the medicines as directed.  Do not drive within 4 hours of taking flexeril as this may make you drowsy.  Avoid lifting,  Bending,  Twisting or any other activity that worsens your pain over the next week.  Apply an  icepack  to your lower back for 10-15 minutes every 2 hours for the next 2 days.  You should get rechecked if your symptoms are not better over the next 5 days,  Or you develop increased pain,  Weakness in your leg(s) or loss of bladder or bowel function - these are symptoms of a worsening condition.

## 2016-02-28 NOTE — ED Triage Notes (Signed)
Pt c/o pain and burning in r shoulder for the past few months.  Reports pain in lower back for the past week.

## 2016-02-28 NOTE — ED Provider Notes (Signed)
[facility]-EMERGENCY DEPT Provider Note   CSN: [id number] Arrival date & time: 02/28/16  1641     History   Chief Complaint Chief Complaint  Patient presents with  . Back Pain  . Shoulder Pain    HPI Maria Curry is a 45 y.o. female presenting with chronic right neck pain and low back pain which has flared up over the past 3 weeks and worsened since yesterday.  She denies any new injuries or falls and has had no weakness, numbness, swelling or pain radiating into any of her extremities.  She denies urinary or bowel retention or incontinence.  Patient does not have a history of cancer or IVDU.  The patient has tried otc medications including advil and tylenol without significant relief of symptoms.  .  The history is provided by the patient.    Past Medical History:  Diagnosis Date  . Anxiety   . Chronic back pain   . GERD (gastroesophageal reflux disease)   . Panic attacks     Patient Active Problem List   Diagnosis Date Noted  . GAD (generalized anxiety disorder) 07/15/2013  . Depression 07/15/2013    Past Surgical History:  Procedure Laterality Date  . ORIF FACIAL FRACTURE     pt has plates to left side of face  . TUBAL LIGATION      OB History    No data available       Home Medications    Prior to Admission medications   Medication Sig Start Date End Date Taking? Authorizing Provider  cyclobenzaprine (FLEXERIL) 5 MG tablet Take 1 tablet (5 mg total) by mouth 3 (three) times daily as needed for muscle spasms. 02/28/16   Burgess AmorJulie Jersie Beel, PA-C  dexamethasone (DECADRON) 6 MG tablet 1 po bid with food Patient not taking: Reported on 01/25/2015 07/07/14   Ivery QualeHobson Bryant, PA-C  diclofenac (VOLTAREN) 75 MG EC tablet Take 1 tablet (75 mg total) by mouth 2 (two) times daily. Patient not taking: Reported on 01/25/2015 06/29/14   Mary-Margaret Daphine DeutscherMartin, FNP  HYDROcodone-acetaminophen (NORCO/VICODIN) 5-325 MG per tablet Take 1 tablet by mouth every 4 (four) hours as needed. 01/25/15    Burgess AmorJulie Lovett Coffin, PA-C  meloxicam (MOBIC) 7.5 MG tablet Take 1-2 tablets (7.5-15 mg total) by mouth daily. 02/28/16   Burgess AmorJulie Fable Huisman, PA-C  ondansetron (ZOFRAN ODT) 4 MG disintegrating tablet Take 1 tablet (4 mg total) by mouth every 8 (eight) hours as needed for nausea or vomiting. Patient not taking: Reported on 01/25/2015 11/29/14   Layla MawKristen N Ward, DO  predniSONE (DELTASONE) 10 MG tablet 6, 5, 4, 3, 2 then 1 tablet by mouth daily for 6 days total. 01/25/15   Burgess AmorJulie Farrell Pantaleo, PA-C  sertraline (ZOLOFT) 50 MG tablet TAKE 1 TABLET BY MOUTH EVERY MORNING 01/14/15   Mary-Margaret Daphine DeutscherMartin, FNP  traMADol (ULTRAM) 50 MG tablet Take 1 tablet (50 mg total) by mouth every 6 (six) hours as needed. Patient not taking: Reported on 01/25/2015 11/29/14   Layla MawKristen N Ward, DO    Family History Family History  Problem Relation Age of Onset  . Heart disease Mother   . Anuerysm Father   . Epilepsy Sister   . Cirrhosis Brother   . Heart disease Brother   . Epilepsy Brother     Social History Social History  Substance Use Topics  . Smoking status: Current Some Day Smoker    Types: Cigarettes  . Smokeless tobacco: Never Used  . Alcohol use Yes     Comment: occasionally  Allergies   Bee venom and Ibuprofen   Review of Systems Review of Systems  Constitutional: Negative for chills and fever.  Respiratory: Negative for shortness of breath.   Cardiovascular: Negative for chest pain and leg swelling.  Gastrointestinal: Negative for abdominal distention, abdominal pain and constipation.  Genitourinary: Negative for difficulty urinating, dysuria, flank pain, frequency and urgency.  Musculoskeletal: Positive for back pain and neck pain. Negative for gait problem, joint swelling and neck stiffness.  Skin: Negative for rash.  Neurological: Negative for weakness and numbness.     Physical Exam Updated Vital Signs BP 133/76 (BP Location: Left Arm)   Pulse 115   Temp 99 F (37.2 C) (Oral)   Resp 18   Ht 5\' 2"   (1.575 m)   Wt 77.1 kg   LMP 01/18/2016   SpO2 100%   BMI 31.09 kg/m   Physical Exam  Constitutional: She appears well-developed and well-nourished.  HENT:  Head: Normocephalic.  Eyes: Conjunctivae are normal.  Neck: Normal range of motion. Neck supple.  Cardiovascular: Normal rate and intact distal pulses.   Pedal pulses normal.  Pulmonary/Chest: Effort normal.  Abdominal: Soft. Bowel sounds are normal. She exhibits no distension and no mass.  Musculoskeletal: Normal range of motion. She exhibits no edema.       Lumbar back: She exhibits tenderness and bony tenderness. She exhibits no swelling, no edema and no spasm.  Tender to palpation midline lumbar spine and bilateral paralumbar tenderness.  There are no step-offs, no edema or masses.  She does have right paracervical muscle spasm.  No midline bony tenderness.  Neurological: She is alert. She has normal strength. She displays no atrophy and no tremor. No sensory deficit. Gait normal.  Reflex Scores:      Bicep reflexes are 2+ on the right side and 2+ on the left side.      Patellar reflexes are 2+ on the right side and 2+ on the left side.      Achilles reflexes are 2+ on the right side and 2+ on the left side. No strength deficit noted in elbow, wrist, hip and knee flexor and extensor muscle groups.  Ankle flexion and extension intact.  Equal grip strength.  Skin: Skin is warm and dry.  Psychiatric: She has a normal mood and affect.  Nursing note and vitals reviewed.    ED Treatments / Results  Labs (all labs ordered are listed, but only abnormal results are displayed) Labs Reviewed - No data to display  EKG  EKG Interpretation None       Radiology No results found.  Procedures Procedures (including critical care time)  Medications Ordered in ED Medications - No data to display   Initial Impression / Assessment and Plan / ED Course  I have reviewed the triage vital signs and the nursing notes.  Pertinent  labs & imaging results that were available during my care of the patient were reviewed by me and considered in my medical decision making (see chart for details).  Clinical Course    Pt with acute on chronic cervical and lumbar pain with no red flag risk factors.  No neuro deficit on exam or by history to suggest emergent or surgical presentation.  Discussed worsened sx that should prompt immediate re-evaluation including distal weakness, bowel/bladder retention/incontinence.       Final Clinical Impressions(s) / ED Diagnoses   Final diagnoses:  Bilateral low back pain without sciatica  Cervical muscle pain    New Prescriptions New  Prescriptions   CYCLOBENZAPRINE (FLEXERIL) 5 MG TABLET    Take 1 tablet (5 mg total) by mouth 3 (three) times daily as needed for muscle spasms.   MELOXICAM (MOBIC) 7.5 MG TABLET    Take 1-2 tablets (7.5-15 mg total) by mouth daily.     Burgess AmorJulie Lenice Koper, PA-C 02/28/16 1831    Maia PlanJoshua G Long, MD 02/29/16 1006

## 2018-03-05 ENCOUNTER — Emergency Department (HOSPITAL_COMMUNITY)
Admission: EM | Admit: 2018-03-05 | Discharge: 2018-03-05 | Disposition: A | Discharge status: Home / Self Care | Payer: Medicaid Other | Attending: Emergency Medicine | Admitting: Emergency Medicine

## 2018-03-05 ENCOUNTER — Encounter (HOSPITAL_COMMUNITY): Payer: Self-pay

## 2018-03-05 ENCOUNTER — Emergency Department (HOSPITAL_COMMUNITY): Payer: Medicaid Other

## 2018-03-05 DIAGNOSIS — K529 Noninfective gastroenteritis and colitis, unspecified: Secondary | ICD-10-CM

## 2018-03-05 DIAGNOSIS — R197 Diarrhea, unspecified: Secondary | ICD-10-CM

## 2018-03-05 DIAGNOSIS — F17210 Nicotine dependence, cigarettes, uncomplicated: Secondary | ICD-10-CM | POA: Insufficient documentation

## 2018-03-05 DIAGNOSIS — Z79899 Other long term (current) drug therapy: Secondary | ICD-10-CM | POA: Insufficient documentation

## 2018-03-05 DIAGNOSIS — R1084 Generalized abdominal pain: Secondary | ICD-10-CM

## 2018-03-05 DIAGNOSIS — R000 Tachycardia, unspecified: Secondary | ICD-10-CM

## 2018-03-05 LAB — COMPREHENSIVE METABOLIC PANEL
ALK PHOS: 122 U/L (ref 38–126)
ALT: 42 U/L (ref 0–44)
ANION GAP: 8 (ref 5–15)
AST: 32 U/L (ref 15–41)
Albumin: 3.9 g/dL (ref 3.5–5.0)
BILIRUBIN TOTAL: 0.8 mg/dL (ref 0.3–1.2)
BUN: 10 mg/dL (ref 6–20)
CALCIUM: 9.1 mg/dL (ref 8.9–10.3)
CO2: 22 mmol/L (ref 22–32)
CREATININE: 0.39 mg/dL — AB (ref 0.44–1.00)
Chloride: 107 mmol/L (ref 98–111)
GFR calc non Af Amer: >60 mL/min (ref >60)
Glucose, Bld: 103 mg/dL — ABNORMAL HIGH (ref 70–99)
Potassium: 4.5 mmol/L (ref 3.5–5.1)
Sodium: 137 mmol/L (ref 135–145)
TOTAL PROTEIN: 7.1 g/dL (ref 6.5–8.1)

## 2018-03-05 LAB — URINALYSIS, ROUTINE W REFLEX MICROSCOPIC
Bilirubin Urine: NEGATIVE
GLUCOSE, UA: NEGATIVE mg/dL
Hgb urine dipstick: NEGATIVE
KETONES UR: NEGATIVE mg/dL
NITRITE: NEGATIVE
PH: 5 (ref 5.0–8.0)
Protein, ur: NEGATIVE mg/dL
SPECIFIC GRAVITY, URINE: 1.026 (ref 1.005–1.030)

## 2018-03-05 LAB — DIFFERENTIAL
BASOS ABS: 0 10*3/uL (ref 0.0–0.1)
Basophils Relative: 0 %
EOS ABS: 0.1 10*3/uL (ref 0.0–0.7)
Eosinophils Relative: 1 %
LYMPHS ABS: 2.3 10*3/uL (ref 0.7–4.0)
LYMPHS PCT: 19 %
MONOS PCT: 6 %
Monocytes Absolute: 0.8 10*3/uL (ref 0.1–1.0)
NEUTROS PCT: 74 %
Neutro Abs: 9.4 10*3/uL — ABNORMAL HIGH (ref 1.7–7.7)

## 2018-03-05 LAB — RAPID URINE DRUG SCREEN, HOSP PERFORMED
Amphetamines: NOT DETECTED
BARBITURATES: NOT DETECTED
BENZODIAZEPINES: NOT DETECTED
COCAINE: NOT DETECTED
Opiates: NOT DETECTED
Tetrahydrocannabinol: NOT DETECTED

## 2018-03-05 LAB — CBC
HCT: 40.9 % (ref 36.0–46.0)
HEMOGLOBIN: 14 g/dL (ref 12.0–15.0)
MCH: 29.1 pg (ref 26.0–34.0)
MCHC: 34.2 g/dL (ref 30.0–36.0)
MCV: 85 fL (ref 78.0–100.0)
PLATELETS: 234 10*3/uL (ref 150–400)
RBC: 4.81 MIL/uL (ref 3.87–5.11)
RDW: 13.1 % (ref 11.5–15.5)
WBC: 12.4 10*3/uL — ABNORMAL HIGH (ref 4.0–10.5)

## 2018-03-05 LAB — LIPASE, BLOOD: Lipase: 43 U/L (ref 11–51)

## 2018-03-05 LAB — PREGNANCY, URINE: Preg Test, Ur: NEGATIVE

## 2018-03-05 MED ORDER — FAMOTIDINE IN NACL 20-0.9 MG/50ML-% IV SOLN
20.0000 mg | Freq: Once | INTRAVENOUS | Status: AC
  Administered 2018-03-05: 20 mg via INTRAVENOUS
  Filled 2018-03-05: qty 50

## 2018-03-05 MED ORDER — SODIUM CHLORIDE 0.9 % IV BOLUS
1000.0000 mL | Freq: Once | INTRAVENOUS | Status: AC
  Administered 2018-03-05: 1000 mL via INTRAVENOUS

## 2018-03-05 MED ORDER — METRONIDAZOLE 500 MG PO TABS: 500.0000 mg | ORAL_TABLET | Freq: Three times a day (TID) | ORAL | 0 refills | Status: DC

## 2018-03-05 MED ORDER — DICYCLOMINE HCL 10 MG/ML IM SOLN
20.0000 mg | Freq: Once | INTRAMUSCULAR | Status: AC
  Administered 2018-03-05: 20 mg via INTRAMUSCULAR
  Filled 2018-03-05: qty 2

## 2018-03-05 MED ORDER — CIPROFLOXACIN HCL 500 MG PO TABS: 500.0000 mg | ORAL_TABLET | Freq: Two times a day (BID) | ORAL | 0 refills | Status: DC

## 2018-03-05 MED ORDER — IOPAMIDOL (ISOVUE-300) INJECTION 61%
100.0000 mL | Freq: Once | INTRAVENOUS | Status: AC | PRN
  Administered 2018-03-05: 100 mL via INTRAVENOUS

## 2018-03-05 MED ORDER — PROMETHAZINE HCL 25 MG PO TABS: 25.0000 mg | ORAL_TABLET | Freq: Four times a day (QID) | ORAL | 0 refills | Status: DC | PRN

## 2018-03-05 MED ORDER — ONDANSETRON HCL 4 MG/2ML IJ SOLN: 4.0000 mg | INTRAMUSCULAR | Status: DC | PRN

## 2018-03-05 NOTE — ED Notes (Signed)
Pt resting in room, hr between 112 and 114

## 2018-03-05 NOTE — ED Notes (Signed)
ED Provider at bedside. 

## 2018-03-05 NOTE — ED Provider Notes (Signed)
[facility] EMERGENCY DEPARTMENT Provider Note   CSN: [id number] Arrival date & time: 03/05/18  1734     History   Chief Complaint Chief Complaint  Patient presents with  . Abdominal Pain  . Tachycardia    HPI Maria Curry is a 47 y.o. female.  HPI  Pt was seen at 1900.  Per pt, c/o gradual onset and persistence of constant generalized abd "pain" for the past 4 days.  Has been associated with nausea and multiple intermittent episodes of diarrhea.  Describes the abd pain as "cramping."  Denies vomiting, no fevers, no back pain, no rash, no CP/SOB, no black or blood in stools. Denies recent abx use or travel.      Past Medical History:  Diagnosis Date  . Anxiety   . Chronic back pain   . GERD (gastroesophageal reflux disease)   . Panic attacks     Patient Active Problem List   Diagnosis Date Noted  . GAD (generalized anxiety disorder) 07/15/2013  . Depression 07/15/2013    Past Surgical History:  Procedure Laterality Date  . ORIF FACIAL FRACTURE     pt has plates to left side of face  . TUBAL LIGATION       OB History   None      Home Medications    Prior to Admission medications   Medication Sig Start Date End Date Taking? Authorizing Provider  meloxicam (MOBIC) 7.5 MG tablet Take 1-2 tablets (7.5-15 mg total) by mouth daily. 02/28/16   Burgess Amor, PA-C  sertraline (ZOLOFT) 50 MG tablet TAKE 1 TABLET BY MOUTH EVERY MORNING 01/14/15   Bennie Pierini, FNP    Family History Family History  Problem Relation Age of Onset  . Heart disease Mother   . Anuerysm Father   . Epilepsy Sister   . Cirrhosis Brother   . Heart disease Brother   . Epilepsy Brother     Social History Social History   Tobacco Use  . Smoking status: Current Every Day Smoker    Packs/day: 1.00    Types: Cigarettes  . Smokeless tobacco: Never Used  Substance Use Topics  . Alcohol use: Yes    Comment: occasionally  . Drug use: No     Allergies   Bee venom and  Ibuprofen   Review of Systems Review of Systems ROS: Statement: All systems negative except as marked or noted in the HPI; Constitutional: Negative for fever and chills. ; ; Eyes: Negative for eye pain, redness and discharge. ; ; ENMT: Negative for ear pain, hoarseness, nasal congestion, sinus pressure and sore throat. ; ; Cardiovascular: Negative for chest pain, palpitations, diaphoresis, dyspnea and peripheral edema. ; ; Respiratory: Negative for cough, wheezing and stridor. ; ; Gastrointestinal: +diarrhea, nausea, abd pain. Negative for vomiting, blood in stool, hematemesis, jaundice and rectal bleeding. . ; ; Genitourinary: Negative for dysuria, flank pain and hematuria. ; ; Musculoskeletal: Negative for back pain and neck pain. Negative for swelling and trauma.; ; Skin: Negative for pruritus, rash, abrasions, blisters, bruising and skin lesion.; ; Neuro: Negative for headache, lightheadedness and neck stiffness. Negative for weakness, altered level of consciousness, altered mental status, extremity weakness, paresthesias, involuntary movement, seizure and syncope.       Physical Exam Updated Vital Signs BP 120/71   Pulse (!) 127   Temp 98.2 F (36.8 C) (Oral)   Resp (!) 21   Ht 5\' 2"  (1.575 m)   Wt 58.5 kg   LMP  (LMP Unknown)  Comment: last month  SpO2 97%   BMI 23.59 kg/m    BP 119/61 (BP Location: Left Arm)   Pulse (!) 111   Temp 98 F (36.7 C) (Oral)   Resp 20   Ht 5\' 2"  (1.575 m)   Wt 58.5 kg   LMP  (LMP Unknown) Comment: last month  SpO2 100%   BMI 23.59 kg/m    Patient Vitals for the past 24 hrs:  BP Temp Temp src Pulse Resp SpO2 Height Weight  03/05/18 2315 - - - (!) 116 - 100 % - -  03/05/18 2300 - - - (!) 115 - 100 % - -  03/05/18 2200 120/63 - - (!) 124 (!) 23 100 % - -  03/05/18 2145 - - - (!) 126 19 98 % - -  03/05/18 2130 115/61 - - (!) 122 (!) 21 97 % - -  03/05/18 2115 - - - (!) 119 (!) 21 98 % - -  03/05/18 2100 119/60 - - (!) 122 (!) 26 99 % - -    03/05/18 2045 - - - - 17 - - -  03/05/18 2030 116/62 - - (!) 119 (!) 24 100 % - -  03/05/18 2025 107/62 - - (!) 119 (!) 23 100 % - -  03/05/18 1830 - - - - (!) 21 - - -  03/05/18 1800 120/71 - - (!) 127 (!) 28 97 % - -  03/05/18 1744 - - - - - - 5\' 2"  (1.575 m) 58.5 kg  03/05/18 1743 (!) 137/93 98.2 F (36.8 C) Oral 66 12 96 % - -      Physical Exam 1905: Physical examination:  Nursing notes reviewed; Vital signs and O2 SAT reviewed;  Constitutional: Well developed, Well nourished, In no acute distress; Head:  Normocephalic, atraumatic; Eyes: EOMI, PERRL, No scleral icterus; ENMT: Mouth and pharynx normal, Mucous membranes dry; Neck: Supple, Full range of motion, No lymphadenopathy; Cardiovascular: Tachycardic rate and rhythm, No gallop; Respiratory: Breath sounds clear & equal bilaterally, No wheezes.  Speaking full sentences with ease, Normal respiratory effort/excursion; Chest: Nontender, Movement normal; Abdomen: Soft, +diffuse tenderness to palp. No rebound or guarding. Nondistended, Normal bowel sounds; Genitourinary: No CVA tenderness; Extremities: Peripheral pulses normal, No tenderness, No edema, No calf edema or asymmetry.; Neuro: AA&Ox3, Major CN grossly intact.  Speech clear. No gross focal motor or sensory deficits in extremities.; Skin: Color normal, Warm, Dry.; Psych:  Anxious.     ED Treatments / Results  Labs (all labs ordered are listed, but only abnormal results are displayed)   EKG EKG Interpretation  Date/Time:  Wednesday March 05 2018 17:47:17 EDT Ventricular Rate:  137 PR Interval:  112 QRS Duration: 64 QT Interval:  278 QTC Calculation: 419 R Axis:   73 Text Interpretation:  Sinus tachycardia Right atrial enlargement When compared with ECG of 04/17/2014 Rate faster Confirmed by Samuel JesterMcManus, Chandelle Harkey [phone]([id number]) on 03/05/2018 6:54:52 PM   Radiology   Procedures Procedures (including critical care time)  Medications Ordered in ED Medications  sodium  chloride 0.9 % bolus 1,000 mL (1,000 mLs Intravenous New Bag/Given 03/05/18 1927)  ondansetron (ZOFRAN) injection 4 mg (has no administration in time range)  famotidine (PEPCID) IVPB 20 mg premix (20 mg Intravenous New Bag/Given 03/05/18 1927)  dicyclomine (BENTYL) injection 20 mg (20 mg Intramuscular Given 03/05/18 1930)     Initial Impression / Assessment and Plan / ED Course  I have reviewed the triage vital signs and the nursing notes.  Pertinent labs & imaging results that were available during my care of the patient were reviewed by me and considered in my medical decision making (see chart for details).  MDM Reviewed: previous chart, nursing note and vitals Reviewed previous: labs and ECG Interpretation: labs, ECG, x-ray and CT scan    Results for orders placed or performed during the hospital encounter of 03/05/18  Lipase, blood  Result Value Ref Range   Lipase 43 11 - 51 U/L  Comprehensive metabolic panel  Result Value Ref Range   Sodium 137 135 - 145 mmol/L   Potassium 4.5 3.5 - 5.1 mmol/L   Chloride 107 98 - 111 mmol/L   CO2 22 22 - 32 mmol/L   Glucose, Bld 103 (H) 70 - 99 mg/dL   BUN 10 6 - 20 mg/dL   Creatinine, Ser 1.61 (L) 0.44 - 1.00 mg/dL   Calcium 9.1 8.9 - 09.6 mg/dL   Total Protein 7.1 6.5 - 8.1 g/dL   Albumin 3.9 3.5 - 5.0 g/dL   AST 32 15 - 41 U/L   ALT 42 0 - 44 U/L   Alkaline Phosphatase 122 38 - 126 U/L   Total Bilirubin 0.8 0.3 - 1.2 mg/dL   GFR calc non Af Amer >60 >60 mL/min   GFR calc Af Amer >60 >60 mL/min   Anion gap 8 5 - 15  CBC  Result Value Ref Range   WBC 12.4 (H) 4.0 - 10.5 K/uL   RBC 4.81 3.87 - 5.11 MIL/uL   Hemoglobin 14.0 12.0 - 15.0 g/dL   HCT 04.5 40.9 - 81.1 %   MCV 85.0 78.0 - 100.0 fL   MCH 29.1 26.0 - 34.0 pg   MCHC 34.2 30.0 - 36.0 g/dL   RDW 91.4 78.2 - 95.6 %   Platelets 234 150 - 400 K/uL  Urinalysis, Routine w reflex microscopic  Result Value Ref Range   Color, Urine AMBER (A) YELLOW   APPearance HAZY (A) CLEAR    Specific Gravity, Urine 1.026 1.005 - 1.030   pH 5.0 5.0 - 8.0   Glucose, UA NEGATIVE NEGATIVE mg/dL   Hgb urine dipstick NEGATIVE NEGATIVE   Bilirubin Urine NEGATIVE NEGATIVE   Ketones, ur NEGATIVE NEGATIVE mg/dL   Protein, ur NEGATIVE NEGATIVE mg/dL   Nitrite NEGATIVE NEGATIVE   Leukocytes, UA TRACE (A) NEGATIVE   RBC / HPF 6-10 0 - 5 RBC/hpf   WBC, UA 0-5 0 - 5 WBC/hpf   Bacteria, UA RARE (A) NONE SEEN   Squamous Epithelial / LPF 0-5 0 - 5   Mucus PRESENT    Hyaline Casts, UA PRESENT   Pregnancy, urine  Result Value Ref Range   Preg Test, Ur NEGATIVE NEGATIVE  Differential  Result Value Ref Range   Neutrophils Relative % 74 %   Neutro Abs 9.4 (H) 1.7 - 7.7 K/uL   Lymphocytes Relative 19 %   Lymphs Abs 2.3 0.7 - 4.0 K/uL   Monocytes Relative 6 %   Monocytes Absolute 0.8 0.1 - 1.0 K/uL   Eosinophils Relative 1 %   Eosinophils Absolute 0.1 0.0 - 0.7 K/uL   Basophils Relative 0 %   Basophils Absolute 0.0 0.0 - 0.1 K/uL  Urine rapid drug screen (hosp performed)  Result Value Ref Range   Opiates NONE DETECTED NONE DETECTED   Cocaine NONE DETECTED NONE DETECTED   Benzodiazepines NONE DETECTED NONE DETECTED   Amphetamines NONE DETECTED NONE DETECTED   Tetrahydrocannabinol NONE DETECTED NONE DETECTED  Barbiturates NONE DETECTED NONE DETECTED   Dg Chest 2 View Result Date: 03/05/2018 CLINICAL DATA:  Abdominal pain, diarrhea EXAM: CHEST - 2 VIEW COMPARISON:  None. FINDINGS: Heart and mediastinal contours are within normal limits. No focal opacities or effusions. No acute bony abnormality. IMPRESSION: No active cardiopulmonary disease. Electronically Signed   By: Charlett Nose M.D.   On: 03/05/2018 20:09   Ct Abdomen Pelvis W Contrast Result Date: 03/05/2018 CLINICAL DATA:  Abdominal pain and diarrhea since this past Saturday. EXAM: CT ABDOMEN AND PELVIS WITH CONTRAST TECHNIQUE: Multidetector CT imaging of the abdomen and pelvis was performed using the standard protocol following  bolus administration of intravenous contrast. CONTRAST:  ISOVUE-300 IOPAMIDOL (ISOVUE-300) INJECTION 61% COMPARISON:  None. FINDINGS: Lower chest: Limited visualization of the lower thorax is negative for focal airspace opacity or pleural effusion. Punctate granuloma are seen within the bilateral lower lobes. Normal heart size.  No pericardial effusion. Hepatobiliary: Normal hepatic contour. There is a minimal amount of focal fatty infiltration adjacent to the fissure for ligamentum teres. No discrete hepatic lesions. Normal appearance of the gallbladder given degree of distention. No radiopaque gallstones. No intra extrahepatic biliary duct dilatation. No ascites. Pancreas: Normal appearance of the pancreas Spleen: Normal appearance of the spleen Adrenals/Urinary Tract: There is symmetric enhancement and excretion of the bilateral kidneys. No definite renal stones on this postcontrast examination. No discrete renal lesions. No urine obstruction or perinephric stranding. Normal appearance the bilateral adrenal glands. Normal appearance of the urinary bladder given degree distention. Stomach/Bowel: Mild circumferential colonic wall thickening without evidence of enteric obstruction. Moderate colonic stool burden. Normal appearance of the terminal ileum and retrocecal appendix. No pneumoperitoneum, pneumatosis or portal venous gas. Vascular/Lymphatic: Eccentric mixed calcified and noncalcified atherosclerotic plaque within the abdominal aorta, not resulting in hemodynamically significant stenosis. The major branch vessels of the abdominal aorta appear patent on this non CTA examination. No bulky retroperitoneal, mesenteric, pelvic or inguinal lymphadenopathy. Reproductive: Note is made of an approximately 2.5 x 1.6 cm hypoattenuating presumably physiologic left-sided adnexal cyst. No discrete right-sided adnexal lesions. No free fluid in the pelvic cul-de-sac. Other: Regional soft tissues appear normal.  Musculoskeletal: No acute or aggressive osseous abnormalities. IMPRESSION: 1. Apparent mild colonic circumferential wall thickening, potentially accentuated due to underdistention though could be seen in the setting of enteritis. No evidence of enteric obstruction. 2.  Aortic Atherosclerosis (ICD10-I70.0). Electronically Signed   By: Simonne Come M.D.   On: 03/05/2018 20:33    2300:  IVF and meds given. HR 111-112 on my re-exam, which is within pt's baseline range for the past several years. Pt's HR actually low 110's when no staff is in exam room but immediately increases to 120's when staff enter room. Pt now states she has "intermittently felt her heart beat fast for years." Pt endorses this feeling now. EKG and monitor with sinus tachycardia. Doubt PE with low risk Wells and pt without hypoxia, or complaints of CP or SOB. No pericardial effusion on CT scan above. Doubt ACS without chest pain. Pt can f/u as outpt with Cards MD. Pt has tol PO well while in the ED without N/V.  No stooling while in the ED.  Abd benign, VSS. Pt does state she feels better and wants to go home now.  Dx and testing d/w pt.  Questions answered.  Verb understanding, agreeable to d/c home with outpt f/u.     Final Clinical Impressions(s) / ED Diagnoses   Final diagnoses:  None  ED Discharge Orders    None       Samuel Jester, DO 03/07/18 2322

## 2018-03-05 NOTE — ED Triage Notes (Signed)
Pt reports abdominal pain and diarrhea since Saturday. Denies vomiting but feels nauseated . Pt reports loose stools very frequently. Stools brown in color. Pt also has felt like her heart is beating fast

## 2018-03-05 NOTE — Discharge Instructions (Signed)
Avoid avoid caffinated products, such as teas, colas, coffee, chocolate. Avoid over the counter cold medicines, herbal or "natural vitamin" products, and illicit drugs because they can contain stimulants. Call the Cardiologist tomorrow to schedule a follow up appointment within the next week. Take the prescriptions as directed.  Increase your fluid intake (ie:  Gatoraide) for the next few days.  Eat a bland diet and advance to your regular diet slowly as you can tolerate it.   Avoid full strength juices, as well as milk and milk products until your diarrhea has resolved.   Call your regular medical doctor tomorrow to schedule a follow up appointment in the next 2 days. Return to the Emergency Department immediately if not improving (or even worsening) despite taking the medicines as prescribed, any black or bloody stool or vomit, if you develop a fever over "101," or for any other concerns.

## 2018-03-05 NOTE — ED Notes (Signed)
EKG given to Dr. McManus.  

## 2018-04-24 ENCOUNTER — Encounter: Payer: Self-pay | Admitting: Cardiovascular Disease

## 2018-04-24 ENCOUNTER — Encounter: Payer: Self-pay | Admitting: *Deleted

## 2018-04-24 ENCOUNTER — Ambulatory Visit (INDEPENDENT_AMBULATORY_CARE_PROVIDER_SITE_OTHER): Payer: Self-pay | Admitting: Cardiovascular Disease

## 2018-04-24 ENCOUNTER — Encounter

## 2018-04-24 VITALS — BP 150/88 | HR 126 | Ht 62.0 in | Wt 135.0 lb

## 2018-04-24 DIAGNOSIS — Z8249 Family history of ischemic heart disease and other diseases of the circulatory system: Secondary | ICD-10-CM

## 2018-04-24 DIAGNOSIS — R0609 Other forms of dyspnea: Secondary | ICD-10-CM

## 2018-04-24 DIAGNOSIS — R030 Elevated blood-pressure reading, without diagnosis of hypertension: Secondary | ICD-10-CM

## 2018-04-24 DIAGNOSIS — R079 Chest pain, unspecified: Secondary | ICD-10-CM

## 2018-04-24 DIAGNOSIS — R000 Tachycardia, unspecified: Secondary | ICD-10-CM

## 2018-04-24 MED ORDER — METOPROLOL TARTRATE 25 MG PO TABS: 25.0000 mg | ORAL_TABLET | Freq: Two times a day (BID) | ORAL | 6 refills | Status: DC

## 2018-04-24 NOTE — Addendum Note (Signed)
Addended by: Lesle Chris on: 04/24/2018 03:43 PM   Modules accepted: Orders

## 2018-04-24 NOTE — Progress Notes (Signed)
CARDIOLOGY CONSULT NOTE  Patient ID: Maria Curry MRN: [id number] DOB/AGE: 08/27/1970 47 y.o.  Admit date: (Not on file) Primary Physician: Bennie Pierini, FNP Referring Physician: Bennie Pierini, FNP  Reason for Consultation: Tachycardia  HPI: Maria Curry is a 47 y.o. female who is being seen today for the evaluation of tachycardia at the request of Daphine Deutscher, Mary-Margaret, *.  Past medical history includes tobacco abuse.  ECG performed in the office today which I ordered and personally interpreted demonstrates sinus tachycardia, 120 bpm.  She was evaluated in the ED for tachycardia on 03/05/2018.  I personally reviewed all relevant duct mentation, labs, and studies.  She also complained of abdominal pain at that time associated with nausea and diarrhea.  She denied chest pain and shortness of breath.  I personally reviewed the ECG performed on 03/05/2018 which demonstrated sinus tachycardia, 137 bpm.  Renal function and potassium and sodium were normal.  White blood cells were mildly elevated at 12.4.  Hemoglobin was normal at 14 and platelets were normal at 234.  Urine drug screen was normal.  Chest x-ray showed no active cardiopulmonary disease.  CT of the abdomen showed mild colonic circumferential wall thickening.  There was no evidence of enteric obstruction.  Aortic atherosclerosis was noted.  She began to experience palpitations with a sensation of her heart racing in February.  She thinks it has gotten progressively worse.  She has not seen her PCP in quite some time.  She has had some upper bilateral chest cramps over the past 2 days.  She has had exertional dyspnea and lightheadedness.  She denies syncope.  She also denies leg swelling, orthopnea, and paroxysmal nocturnal dyspnea.  She drinks 3 cups of coffee every morning and drinks soda and Gatorade throughout the day.  Whenever she eats her heart begins to race.  Family history: Mother died  of CHF in her 22s.  Brother died of an MI at the age of 68.   Allergies  Allergen Reactions  . Bee Venom Swelling  . Ibuprofen Nausea Only    Current Outpatient Medications  Medication Sig Dispense Refill  . promethazine (PHENERGAN) 25 MG tablet Take 1 tablet (25 mg total) by mouth every 6 (six) hours as needed for nausea or vomiting. 8 tablet 0   No current facility-administered medications for this visit.     Past Medical History:  Diagnosis Date  . Anxiety   . Chronic back pain   . GERD (gastroesophageal reflux disease)   . Panic attacks     Past Surgical History:  Procedure Laterality Date  . ORIF FACIAL FRACTURE     pt has plates to left side of face  . TUBAL LIGATION      Social History   Socioeconomic History  . Marital status: Married    Spouse name: Not on file  . Number of children: Not on file  . Years of education: Not on file  . Highest education level: Not on file  Occupational History  . Not on file  Social Needs  . Financial resource strain: Not on file  . Food insecurity:    Worry: Not on file    Inability: Not on file  . Transportation needs:    Medical: Not on file    Non-medical: Not on file  Tobacco Use  . Smoking status: Current Every Day Smoker    Packs/day: 1.00    Types: Cigarettes  . Smokeless tobacco: Never Used  Substance  and Sexual Activity  . Alcohol use: Yes    Comment: occasionally  . Drug use: No  . Sexual activity: Not Currently    Birth control/protection: Surgical  Lifestyle  . Physical activity:    Days per week: Not on file    Minutes per session: Not on file  . Stress: Not on file  Relationships  . Social connections:    Talks on phone: Not on file    Gets together: Not on file    Attends religious service: Not on file    Active member of club or organization: Not on file    Attends meetings of clubs or organizations: Not on file    Relationship status: Not on file  . Intimate partner violence:    Fear of  current or ex partner: Not on file    Emotionally abused: Not on file    Physically abused: Not on file    Forced sexual activity: Not on file  Other Topics Concern  . Not on file  Social History Narrative  . Not on file      Current Meds  Medication Sig  . promethazine (PHENERGAN) 25 MG tablet Take 1 tablet (25 mg total) by mouth every 6 (six) hours as needed for nausea or vomiting.      Review of systems complete and found to be negative unless listed above in HPI    Physical exam Blood pressure (!) 150/88, pulse (!) 126, height 5\' 2"  (1.575 m), weight 135 lb (61.2 kg), SpO2 98 %. General: NAD Neck: No JVD, no thyromegaly or thyroid nodule.  Lungs: Clear to auscultation bilaterally with normal respiratory effort. CV: Nondisplaced PMI.  Tachycardic, regular rhythm, normal S1/S2, no S3/S4, no murmur.  No peripheral edema.  No carotid bruit.    Abdomen: Soft, nontender, no distention.  Skin: Intact without lesions or rashes.  Neurologic: Alert and oriented x 3.  Psych: Normal affect. Extremities: No clubbing or cyanosis.  HEENT: Normal.   ECG: Most recent ECG reviewed.   Labs: Lab Results  Component Value Date/Time   K 4.5 03/05/2018 06:44 PM   BUN 10 03/05/2018 06:44 PM   CREATININE 0.39 (L) 03/05/2018 06:44 PM   ALT 42 03/05/2018 06:44 PM   HGB 14.0 03/05/2018 06:44 PM     Lipids: No results found for: LDLCALC, LDLDIRECT, CHOL, TRIG, HDL      ASSESSMENT AND PLAN:  1.  Tachycardia: I will check a TSH and free T4 to rule out hyperthyroidism.  I will start Lopressor 25 mg twice daily.  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 I will order a 2-D echocardiogram with Doppler to evaluate cardiac structure, function, and regional wall motion.  2.  Elevated blood pressure: Blood pressure is elevated and she does not carry a diagnosis of hypertension, although she does not go to her PCP routinely.  I will start pressor 25 mg twice daily both for the  treatment of hypertension and tachycardia.  3.  Chest tightness and exertional dyspnea: She has a history of tobacco abuse and a strong family history of premature coronary artery disease. I will proceed with a nuclear myocardial perfusion imaging study to evaluate for ischemic heart disease (Lexiscan Myoview). I will order a 2-D echocardiogram with Doppler to evaluate cardiac structure, function, and regional wall motion.    Disposition: Follow up in 2-3 months  Signed: Prentice Docker, M.D., F.A.C.C.  04/24/2018, 2:59 PM

## 2018-04-24 NOTE — Patient Instructions (Signed)
Medication Instructions:   Begin Lopressor 25mg  twice a day.  Continue all other medications.    Labwork: TSH, free T4 - orders given today.  Testing/Procedures:  Your physician has requested that you have an echocardiogram. Echocardiography is a painless test that uses sound waves to create images of your heart. It provides your doctor with information about the size and shape of your heart and how well your heart's chambers and valves are working. This procedure takes approximately one hour. There are no restrictions for this procedure.  Your physician has requested that you have a lexiscan myoview. For further information please visit [email or web address] Please follow instruction sheet, as given.  Office will contact with results via phone or letter.    Follow-Up: 2-3 months   Any Other Special Instructions Will Be Listed Below (If Applicable).  If you need a refill on your cardiac medications before your next appointment, please call your pharmacy.

## 2018-04-30 ENCOUNTER — Encounter (HOSPITAL_COMMUNITY)
Admission: RE | Admit: 2018-04-30 | Discharge: 2018-04-30 | Disposition: A | Discharge status: Home / Self Care | Payer: Self-pay | Source: Ambulatory Visit | Attending: Cardiovascular Disease | Admitting: Cardiovascular Disease

## 2018-04-30 ENCOUNTER — Ambulatory Visit (HOSPITAL_COMMUNITY)
Admission: RE | Admit: 2018-04-30 | Discharge: 2018-04-30 | Disposition: A | Discharge status: Home / Self Care | Payer: Self-pay | Source: Ambulatory Visit | Attending: Cardiovascular Disease | Admitting: Cardiovascular Disease

## 2018-04-30 ENCOUNTER — Encounter (HOSPITAL_COMMUNITY): Payer: Self-pay

## 2018-04-30 DIAGNOSIS — R079 Chest pain, unspecified: Secondary | ICD-10-CM

## 2018-04-30 DIAGNOSIS — R000 Tachycardia, unspecified: Secondary | ICD-10-CM | POA: Insufficient documentation

## 2018-04-30 LAB — NM MYOCAR MULTI W/SPECT W/WALL MOTION / EF
CHL CUP NUCLEAR SDS: 2
CHL CUP RESTING HR STRESS: 104 {beats}/min
CSEPPHR: 120 {beats}/min
LVDIAVOL: 119 mL (ref 46–106)
LVSYSVOL: 49 mL
RATE: 0.45
SRS: 1
SSS: 3
TID: 1.04

## 2018-04-30 MED ORDER — TECHNETIUM TC 99M TETROFOSMIN IV KIT
30.0000 | PACK | Freq: Once | INTRAVENOUS | Status: AC | PRN
  Administered 2018-04-30: 33 via INTRAVENOUS

## 2018-04-30 MED ORDER — SODIUM CHLORIDE 0.9% FLUSH
INTRAVENOUS | Status: AC
  Administered 2018-04-30: 10 mL via INTRAVENOUS
  Filled 2018-04-30: qty 10

## 2018-04-30 MED ORDER — TECHNETIUM TC 99M TETROFOSMIN IV KIT
10.0000 | PACK | Freq: Once | INTRAVENOUS | Status: AC | PRN
  Administered 2018-04-30: 10.9 via INTRAVENOUS

## 2018-04-30 MED ORDER — REGADENOSON 0.4 MG/5ML IV SOLN
INTRAVENOUS | Status: AC
  Administered 2018-04-30: 0.4 mg via INTRAVENOUS
  Filled 2018-04-30: qty 5

## 2018-05-02 ENCOUNTER — Ambulatory Visit (HOSPITAL_COMMUNITY): Admission: RE | Admit: 2018-05-02 | Payer: Self-pay | Source: Ambulatory Visit

## 2018-05-05 ENCOUNTER — Telehealth: Payer: Self-pay | Admitting: Cardiovascular Disease

## 2018-05-05 NOTE — Telephone Encounter (Signed)
Called pt. No answer, unable to leave msg.  

## 2018-05-07 NOTE — Telephone Encounter (Signed)
Notes recorded by Lesle Chris, LPN on 16/04/9603 at 4:45 PM EDT Patient notified. Copy to pmd. Follow up scheduled for 06/27/2018 in [place] office with Randall An, PA. ------  Notes recorded by Lesle Chris, LPN on 54/03/8118 at 5:43 PM EDT Mailbox full. ------  Notes recorded by Laqueta Linden, MD on 04/30/2018 at 5:56 PM EDT This study demonstrates: No evidence for blockages. Medication changes / Follow up studies / Other recommendations:  None. Please send results to the PCP: Bennie Pierini, FNP  Prentice Docker, MD 04/30/2018 5:55 PM

## 2018-05-07 NOTE — Telephone Encounter (Signed)
Unable to leave patient a voice mail

## 2018-06-27 ENCOUNTER — Ambulatory Visit: Payer: Self-pay | Admitting: Student

## 2018-07-29 ENCOUNTER — Ambulatory Visit (INDEPENDENT_AMBULATORY_CARE_PROVIDER_SITE_OTHER): Payer: BLUE CROSS/BLUE SHIELD | Admitting: Student

## 2018-07-29 ENCOUNTER — Encounter: Payer: Self-pay | Admitting: Student

## 2018-07-29 ENCOUNTER — Encounter: Payer: Self-pay | Admitting: *Deleted

## 2018-07-29 VITALS — BP 116/70 | HR 102 | Ht 62.0 in | Wt 141.0 lb

## 2018-07-29 DIAGNOSIS — R000 Tachycardia, unspecified: Secondary | ICD-10-CM | POA: Diagnosis not present

## 2018-07-29 DIAGNOSIS — R079 Chest pain, unspecified: Secondary | ICD-10-CM | POA: Diagnosis not present

## 2018-07-29 MED ORDER — METOPROLOL TARTRATE 50 MG PO TABS: 50.0000 mg | ORAL_TABLET | Freq: Two times a day (BID) | ORAL | 3 refills | Status: DC

## 2018-07-29 NOTE — Patient Instructions (Signed)
Medication Instructions:  Your physician has recommended you make the following change in your medication:  Increase Lopressor to 50 mg Two Times Daily   If you need a refill on your cardiac medications before your next appointment, please call your pharmacy.   Lab work: NONE  If you have labs (blood work) drawn today and your tests are completely normal, you will receive your results only by: Marland Kitchen MyChart Message (if you have MyChart) OR . A paper copy in the mail If you have any lab test that is abnormal or we need to change your treatment, we will call you to review the results.  Testing/Procedures: Please call office when ready to schedule ECHO   Follow-Up: At [facility], you and your health needs are our priority.  As part of our continuing mission to provide you with exceptional heart care, we have created designated Provider Care Teams.  These Care Teams include your primary Cardiologist (physician) and Advanced Practice Providers (APPs -  Physician Assistants and Nurse Practitioners) who all work together to provide you with the care you need, when you need it. You will need a follow up appointment in 4 months.  Please call our office 2 months in advance to schedule this appointment.  You may see Prentice Docker, MD or one of the following Advanced Practice Providers on your designated Care Team:   Randall An, PA-C [facility]) . Jacolyn Reedy, PA-C [facility] Office)  Any Other Special Instructions Will Be Listed Below (If Applicable). Thank you for choosing [facility]!

## 2018-07-29 NOTE — Progress Notes (Signed)
Cardiology Office Note    Date:  07/29/2018   ID:  Maria Curry, DOB 1971-01-13, MRN [id number]  PCP:  Maria Curry, Mary-Margaret, Maria Curry  Cardiologist: Prentice DockerSuresh Koneswaran, MD    Chief Complaint  Patient presents with  . Follow-up    2 month visit    History of Present Illness:    Maria Curry is a 48 y.o. female with past medical history of tachycardia and chronic back pain who presents to the office today for 855-month follow-up.  She was last examined by Dr. Purvis SheffieldKoneswaran in 04/2018 as a new patient referral for tachycardia.  Ported having palpitations starting in 08/2017 and felt like her symptoms had progressively worsened. She also noted intermittent cramping along her upper chest for the past few days leading up to her office visit. Was recommended she undergo thyroid testing to rule out hyperthyroidism but it does not appear this was obtained. She was started on Lopressor 25 mg twice daily for tachycardia and an echocardiogram and stress test were recommended for further evaluation.  An echocardiogram was not performed but she did undergo stress testing which showed no evidence of myocardial ischemia or scar, overall being a low risk study.  In talking with the patient today, she reports some improvement in her palpitations but is still having frequently elevated heart rates variable in the 110's to 150's when checked at home. Says this has been occurring since Valium was discontinued by her PCP last year. Has been under increased stress due to financial concerns and not having a car for transportation. She reports associated palpitations when this occurs but denies any lightheadedness, dizziness, or presyncope. No recent chest pain, dyspnea on exertion, orthopnea, or PND.  She reports good compliance with Lopressor 25 mg twice daily but has not checked her heart rate or blood pressure at home within the past several weeks. BP is at 116/70 during today's visit with heart rate initially  elevated to 116, improved to 102 on recheck. Denies any alcohol or caffeine use.    Past Medical History:  Diagnosis Date  . Anxiety   . Chronic back pain   . GERD (gastroesophageal reflux disease)   . Panic attacks     Past Surgical History:  Procedure Laterality Date  . ORIF FACIAL FRACTURE     pt has plates to left side of face  . TUBAL LIGATION      Current Medications: Outpatient Medications Prior to Visit  Medication Sig Dispense Refill  . metoprolol tartrate (LOPRESSOR) 25 MG tablet Take 1 tablet (25 mg total) by mouth 2 (two) times daily. 60 tablet 6  . promethazine (PHENERGAN) 25 MG tablet Take 1 tablet (25 mg total) by mouth every 6 (six) hours as needed for nausea or vomiting. 8 tablet 0   No facility-administered medications prior to visit.      Allergies:   Bee venom and Ibuprofen   Social History   Socioeconomic History  . Marital status: Married    Spouse name: Not on file  . Number of children: Not on file  . Years of education: Not on file  . Highest education level: Not on file  Occupational History  . Not on file  Social Needs  . Financial resource strain: Not on file  . Food insecurity:    Worry: Not on file    Inability: Not on file  . Transportation needs:    Medical: Not on file    Non-medical: Not on file  Tobacco Use  .  Smoking status: Current Every Day Smoker    Packs/day: 1.00    Types: Cigarettes  . Smokeless tobacco: Never Used  Substance and Sexual Activity  . Alcohol use: Not Currently    Comment: occasionally  . Drug use: No  . Sexual activity: Not Currently    Birth control/protection: Surgical  Lifestyle  . Physical activity:    Days per week: Not on file    Minutes per session: Not on file  . Stress: Not on file  Relationships  . Social connections:    Talks on phone: Not on file    Gets together: Not on file    Attends religious service: Not on file    Active member of club or organization: Not on file    Attends  meetings of clubs or organizations: Not on file    Relationship status: Not on file  Other Topics Concern  . Not on file  Social History Narrative  . Not on file     Family History:  The patient's family history includes Anuerysm in her father; Cirrhosis in her brother; Epilepsy in her brother and sister; Heart disease in her brother and mother.   Review of Systems:   Please see the history of present illness.     General:  No chills, fever, night sweats or weight changes.  Cardiovascular:  No chest pain, dyspnea on exertion, edema, orthopnea, paroxysmal nocturnal dyspnea. Positive for palpitations.  Dermatological: No rash, lesions/masses Respiratory: No cough, dyspnea Urologic: No hematuria, dysuria Abdominal:   No nausea, vomiting, diarrhea, bright red blood per rectum, melena, or hematemesis Neurologic:  No visual changes, wkns, changes in mental status. All other systems reviewed and are otherwise negative except as noted above.   Physical Exam:    VS:  BP 116/70   Pulse (!) 102   Ht 5\' 2"  (1.575 m)   Wt 141 lb (64 kg)   SpO2 98%   BMI 25.79 kg/m    General: Well developed, well nourished Caucasian female appearing in no acute distress. Head: Normocephalic, atraumatic, sclera non-icteric, no xanthomas, nares are without discharge.  Neck: No carotid bruits. JVD not elevated.  Lungs: Respirations regular and unlabored, without wheezes or rales.  Heart: Regular rhythm, tachycardiac rate. No S3 or S4.  No murmur, no rubs, or gallops appreciated. Abdomen: Soft, non-tender, non-distended with normoactive bowel sounds. No hepatomegaly. No rebound/guarding. No obvious abdominal masses. Msk:  Strength and tone appear normal for age. No joint deformities or effusions. Extremities: No clubbing or cyanosis. No lower extremity edema.  Distal pedal pulses are 2+ bilaterally. Neuro: Alert and oriented X 3. Moves all extremities spontaneously. No focal deficits noted. Psych:  Responds  to questions appropriately with a normal affect. Skin: No rashes or lesions noted  Wt Readings from Last 3 Encounters:  07/29/18 141 lb (64 kg)  04/24/18 135 lb (61.2 kg)  03/05/18 129 lb (58.5 kg)     Studies/Labs Reviewed:   EKG:  EKG is not ordered today.  Recent Labs: 03/05/2018: ALT 42; BUN 10; Creatinine, Ser 0.39; Hemoglobin 14.0; Platelets 234; Potassium 4.5; Sodium 137   Lipid Panel No results found for: CHOL, TRIG, HDL, CHOLHDL, VLDL, LDLCALC, LDLDIRECT  Additional studies/ records that were reviewed today include:   NST: 04/2018  There was no ST segment deviation noted during stress.  The study is normal. No myocardial ischemia or scar.  This is a low risk study.  Nuclear stress EF: 59%.  Assessment:    1. Tachycardia  2. Chest pain, unspecified type      Plan:   In order of problems listed above:  1. Tachycardia - has been occurring since 08/2017 per the patient's report. Has anxiety at baseline and was previously on medications for this but has not followed up with her PCP recently. She reports associated palpitations when her HR is elevated and recurrent EKG's have shown sinus tachycardia. Reports having labs drawn at the time of her last office visit but [facility]. Will request these records. She did not undergo an echocardiogram due to transportation issues and will call back to reschedule. - Heart rate remains elevated in the low 100's to 110's during today's visit and her BP does allow for further titration of Lopressor. Will plan to titrate to 50 mg twice daily. I have asked her to keep a HR/BP log and return this within several weeks. If symptoms persist or worsen despite BB therapy, would plan for a 30-day cardiac event monitor. Also encouraged her to follow-up with her PCP in regards to underlying anxiety.   2. Precordial Chest Pain  - She denies any recent symptoms. NST in 04/2018 showed no evidence of myocardial  ischemia or scar, overall being a low risk study as outlined above. No indication for further testing at this time.   Medication Adjustments/Labs and Tests Ordered: Current medicines are reviewed at length with the patient today.  Concerns regarding medicines are outlined above.  Medication changes, Labs and Tests ordered today are listed in the Patient Instructions below. Patient Instructions  Medication Instructions:  Your physician has recommended you make the following change in your medication:  Increase Lopressor to 50 mg Two Times Daily   If you need a refill on your cardiac medications before your next appointment, please call your pharmacy.   Lab work: NONE  If you have labs (blood work) drawn today and your tests are completely normal, you will receive your results only by: Marland Kitchen. MyChart Message (if you have MyChart) OR . A paper copy in the mail If you have any lab test that is abnormal or we need to change your treatment, we will call you to review the results.  Testing/Procedures: Please call office when ready to schedule ECHO   Follow-Up: At [facility], you and your health needs are our priority.  As part of our continuing mission to provide you with exceptional heart care, we have created designated Provider Care Teams.  These Care Teams include your primary Cardiologist (physician) and Advanced Practice Providers (APPs -  Physician Assistants and Nurse Practitioners) who all work together to provide you with the care you need, when you need it. You will need a follow up appointment in 4 months.  Please call our office 2 months in advance to schedule this appointment.  You may see Prentice DockerSuresh Koneswaran, MD or one of the following Advanced Practice Providers on your designated Care Team:   Randall AnBrittany Adaliz Dobis, PA-C [facility]([place] Office) . Jacolyn ReedyMichele Lenze, PA-C [facility]([place] Office)  Any Other Special Instructions Will Be Listed Below (If Applicable). Thank you for choosing [facility]!    Signed, Ellsworth LennoxBrittany M Shakima Nisley, PA-C  07/29/2018 4:31 PM     [facility] [address] [place], [place] [address] Phone: [phone] Fax: [phone]

## 2018-07-30 ENCOUNTER — Telehealth: Payer: Self-pay | Admitting: Student

## 2018-07-30 DIAGNOSIS — E0590 Thyrotoxicosis, unspecified without thyrotoxic crisis or storm: Secondary | ICD-10-CM

## 2018-07-30 NOTE — Telephone Encounter (Addendum)
    Please let the patient know that we received her thyroid testing from 04/2018 and this was abnormal and suggests hyperthyroidism which is likely contributing to her tachycardia. I recommend she follow-up with her PCP within the next 1-2 weeks. If I recall, she had not followed up with them in several years per her report. If no recent follow-up, please enter a direct referral to Endocrinology for hyperthyroidism.   Signed, Ellsworth Lennox, PA-C 07/30/2018, 4:58 PM Pager: [phone]

## 2018-07-30 NOTE — Telephone Encounter (Signed)
Called pt., no answer, no voicemail.  

## 2018-07-31 NOTE — Telephone Encounter (Signed)
    I called the patient yesterday evening and reviewed results. She has not been evaluated by her PCP in over 3 years and requests we enter the referral to Endocrinology. Requests referral to Endocrinology here in [place] if available.   Signed, Ellsworth Lennox, PA-C 07/31/2018, 7:23 AM Pager: [phone]

## 2018-07-31 NOTE — Addendum Note (Signed)
Addended by: Kerney Elbe on: 07/31/2018 08:04 AM   Modules accepted: Orders

## 2018-09-22 ENCOUNTER — Ambulatory Visit: Payer: Self-pay | Admitting: "Endocrinology

## 2018-11-07 ENCOUNTER — Ambulatory Visit: Payer: BLUE CROSS/BLUE SHIELD | Admitting: Cardiovascular Disease

## 2018-11-07 ENCOUNTER — Encounter: Payer: Self-pay | Admitting: Cardiovascular Disease

## 2018-12-09 ENCOUNTER — Ambulatory Visit: Payer: Medicaid Other | Admitting: Physician Assistant

## 2019-02-07 IMAGING — NM NM MYOCAR MULTI W/SPECT W/WALL MOTION & EF
2 series · 12 of 12 positions shown · non-contrast
Comparison: none

[Series 1: rest · 6.51mm/px · 6 of 64 frames shown]
[frame 6/64]
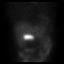
[frame 16/64]
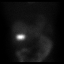
[frame 27/64]
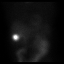
[frame 38/64]
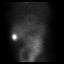
[frame 48/64]
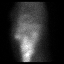
[frame 59/64]
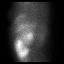

[Series 3: stress gated - perfusion · 6.51mm/px · 6 of 64 frames shown]
[frame 6/64]
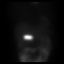
[frame 16/64]
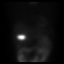
[frame 27/64]
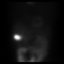
[frame 38/64]
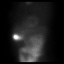
[frame 48/64]
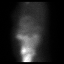
[frame 59/64]
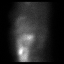

[12 of 12 positions shown; findings below may reference images not displayed]

Canned report from images found in remote index.

Refer to host system for actual result text.

## 2019-09-18 ENCOUNTER — Other Ambulatory Visit: Payer: Self-pay | Admitting: Student

## 2021-10-05 DIAGNOSIS — R69 Illness, unspecified: Secondary | ICD-10-CM | POA: Diagnosis not present

## 2021-10-05 DIAGNOSIS — E0590 Thyrotoxicosis, unspecified without thyrotoxic crisis or storm: Secondary | ICD-10-CM | POA: Diagnosis not present

## 2021-10-05 DIAGNOSIS — R42 Dizziness and giddiness: Secondary | ICD-10-CM | POA: Diagnosis not present

## 2021-10-05 DIAGNOSIS — R110 Nausea: Secondary | ICD-10-CM | POA: Diagnosis not present

## 2021-10-05 DIAGNOSIS — Z79899 Other long term (current) drug therapy: Secondary | ICD-10-CM | POA: Diagnosis not present

## 2021-10-05 DIAGNOSIS — R000 Tachycardia, unspecified: Secondary | ICD-10-CM | POA: Diagnosis not present

## 2021-10-05 DIAGNOSIS — R9431 Abnormal electrocardiogram [ECG] [EKG]: Secondary | ICD-10-CM | POA: Diagnosis not present

## 2022-08-29 DIAGNOSIS — R5383 Other fatigue: Secondary | ICD-10-CM | POA: Diagnosis not present

## 2022-08-29 DIAGNOSIS — E0590 Thyrotoxicosis, unspecified without thyrotoxic crisis or storm: Secondary | ICD-10-CM | POA: Diagnosis not present

## 2022-08-29 DIAGNOSIS — F17210 Nicotine dependence, cigarettes, uncomplicated: Secondary | ICD-10-CM | POA: Diagnosis not present

## 2022-08-29 DIAGNOSIS — Z6830 Body mass index (BMI) 30.0-30.9, adult: Secondary | ICD-10-CM | POA: Diagnosis not present

## 2022-09-03 DIAGNOSIS — E0590 Thyrotoxicosis, unspecified without thyrotoxic crisis or storm: Secondary | ICD-10-CM | POA: Diagnosis not present

## 2022-10-03 ENCOUNTER — Telehealth: Payer: Self-pay | Admitting: Nurse Practitioner

## 2022-10-03 NOTE — Telephone Encounter (Signed)
Received referral on pt for her thyroid, labs and ultrasound normal. Whitney stated she does not need to see endocrinology. Closed and denied in chart and proficient
